# Patient Record
Sex: Female | Born: 1970
Health system: Southern US, Community
[De-identification: ages and names within clinical notes are randomized; demographics above are authoritative.]

## PROBLEM LIST (undated history)

## (undated) DIAGNOSIS — F419 Anxiety disorder, unspecified: Secondary | ICD-10-CM

## (undated) HISTORY — DX: Anxiety disorder, unspecified: F41.9

---

## 2000-10-25 ENCOUNTER — Inpatient Hospital Stay (HOSPITAL_COMMUNITY): Admission: AD | Admit: 2000-10-25 | Discharge: 2000-10-27 | Payer: Self-pay | Admitting: *Deleted

## 2001-12-06 ENCOUNTER — Other Ambulatory Visit: Admission: RE | Admit: 2001-12-06 | Discharge: 2001-12-06 | Payer: Self-pay | Admitting: Obstetrics and Gynecology

## 2002-12-26 ENCOUNTER — Other Ambulatory Visit: Admission: RE | Admit: 2002-12-26 | Discharge: 2002-12-26 | Payer: Self-pay | Admitting: Obstetrics and Gynecology

## 2003-07-06 ENCOUNTER — Inpatient Hospital Stay (HOSPITAL_COMMUNITY): Admission: AD | Admit: 2003-07-06 | Discharge: 2003-07-08 | Payer: Self-pay | Admitting: Obstetrics and Gynecology

## 2003-07-12 ENCOUNTER — Encounter: Admission: RE | Admit: 2003-07-12 | Discharge: 2003-07-12 | Payer: Self-pay | Admitting: Obstetrics and Gynecology

## 2003-07-12 ENCOUNTER — Encounter: Payer: Self-pay | Admitting: Obstetrics and Gynecology

## 2003-08-08 ENCOUNTER — Other Ambulatory Visit: Admission: RE | Admit: 2003-08-08 | Discharge: 2003-08-08 | Payer: Self-pay | Admitting: Obstetrics and Gynecology

## 2003-08-12 ENCOUNTER — Encounter: Payer: Self-pay | Admitting: Obstetrics and Gynecology

## 2003-08-12 ENCOUNTER — Encounter: Admission: RE | Admit: 2003-08-12 | Discharge: 2003-08-12 | Payer: Self-pay | Admitting: Obstetrics and Gynecology

## 2003-10-15 ENCOUNTER — Encounter: Admission: RE | Admit: 2003-10-15 | Discharge: 2003-10-15 | Payer: Self-pay | Admitting: Obstetrics and Gynecology

## 2004-04-01 ENCOUNTER — Encounter: Admission: RE | Admit: 2004-04-01 | Discharge: 2004-04-01 | Payer: Self-pay | Admitting: Obstetrics and Gynecology

## 2004-10-28 ENCOUNTER — Other Ambulatory Visit: Admission: RE | Admit: 2004-10-28 | Discharge: 2004-10-28 | Payer: Self-pay | Admitting: Obstetrics and Gynecology

## 2004-11-19 ENCOUNTER — Encounter: Admission: RE | Admit: 2004-11-19 | Discharge: 2004-11-19 | Payer: Self-pay | Admitting: Internal Medicine

## 2005-11-04 ENCOUNTER — Other Ambulatory Visit: Admission: RE | Admit: 2005-11-04 | Discharge: 2005-11-04 | Payer: Self-pay | Admitting: Obstetrics and Gynecology

## 2007-07-11 ENCOUNTER — Encounter: Admission: RE | Admit: 2007-07-11 | Discharge: 2007-07-11 | Payer: Self-pay | Admitting: Obstetrics and Gynecology

## 2008-01-30 ENCOUNTER — Encounter: Admission: RE | Admit: 2008-01-30 | Discharge: 2008-01-30 | Payer: Self-pay | Admitting: Internal Medicine

## 2010-12-20 ENCOUNTER — Encounter: Payer: Self-pay | Admitting: Obstetrics and Gynecology

## 2013-11-29 HISTORY — PX: BREAST ENHANCEMENT SURGERY: SHX7

## 2015-12-16 MED FILL — SERTRALINE HCL 100 MG TAB: 100 | 90 days supply | Qty: 90 | Fill #3

## 2015-12-16 MED FILL — PANTOPRAZOLE SOD DR 40 MG T: 40 | 90 days supply | Qty: 90 | Fill #3

## 2016-03-02 DIAGNOSIS — Z0000 Encounter for general adult medical examination without abnormal findings: Secondary | ICD-10-CM | POA: Diagnosis not present

## 2016-03-02 DIAGNOSIS — Z23 Encounter for immunization: Secondary | ICD-10-CM | POA: Diagnosis not present

## 2016-03-02 DIAGNOSIS — J3089 Other allergic rhinitis: Secondary | ICD-10-CM | POA: Diagnosis not present

## 2016-03-02 DIAGNOSIS — R5383 Other fatigue: Secondary | ICD-10-CM | POA: Diagnosis not present

## 2016-03-02 DIAGNOSIS — Z6820 Body mass index (BMI) 20.0-20.9, adult: Secondary | ICD-10-CM | POA: Diagnosis not present

## 2016-03-02 DIAGNOSIS — J3489 Other specified disorders of nose and nasal sinuses: Secondary | ICD-10-CM | POA: Diagnosis not present

## 2016-03-02 DIAGNOSIS — Z1389 Encounter for screening for other disorder: Secondary | ICD-10-CM | POA: Diagnosis not present

## 2016-03-02 DIAGNOSIS — K649 Unspecified hemorrhoids: Secondary | ICD-10-CM | POA: Diagnosis not present

## 2016-03-02 MED FILL — SERTRALINE HCL 100 MG TAB: 100 | 90 days supply | Qty: 90 | Fill #0

## 2016-03-02 MED FILL — PANTOPRAZOLE SOD DR 40 MG T: 40 | 90 days supply | Qty: 90 | Fill #0

## 2016-03-26 MED FILL — DOXYCYCLINE HYCLATE 100 MG: 100 | 14 days supply | Qty: 14 | Fill #0

## 2016-05-07 DIAGNOSIS — Z23 Encounter for immunization: Secondary | ICD-10-CM | POA: Diagnosis not present

## 2016-05-12 DIAGNOSIS — S86112A Strain of other muscle(s) and tendon(s) of posterior muscle group at lower leg level, left leg, initial encounter: Secondary | ICD-10-CM | POA: Diagnosis not present

## 2016-05-17 DIAGNOSIS — S86112D Strain of other muscle(s) and tendon(s) of posterior muscle group at lower leg level, left leg, subsequent encounter: Secondary | ICD-10-CM | POA: Diagnosis not present

## 2016-05-31 DIAGNOSIS — S86112D Strain of other muscle(s) and tendon(s) of posterior muscle group at lower leg level, left leg, subsequent encounter: Secondary | ICD-10-CM | POA: Diagnosis not present

## 2016-06-02 DIAGNOSIS — S86112D Strain of other muscle(s) and tendon(s) of posterior muscle group at lower leg level, left leg, subsequent encounter: Secondary | ICD-10-CM | POA: Diagnosis not present

## 2016-06-07 DIAGNOSIS — Z23 Encounter for immunization: Secondary | ICD-10-CM | POA: Diagnosis not present

## 2016-06-08 DIAGNOSIS — S86112D Strain of other muscle(s) and tendon(s) of posterior muscle group at lower leg level, left leg, subsequent encounter: Secondary | ICD-10-CM | POA: Diagnosis not present

## 2016-06-21 DIAGNOSIS — S86112D Strain of other muscle(s) and tendon(s) of posterior muscle group at lower leg level, left leg, subsequent encounter: Secondary | ICD-10-CM | POA: Diagnosis not present

## 2016-06-23 DIAGNOSIS — S86112D Strain of other muscle(s) and tendon(s) of posterior muscle group at lower leg level, left leg, subsequent encounter: Secondary | ICD-10-CM | POA: Diagnosis not present

## 2016-06-29 DIAGNOSIS — Z1389 Encounter for screening for other disorder: Secondary | ICD-10-CM | POA: Diagnosis not present

## 2016-06-29 DIAGNOSIS — Z130 Encounter for screening for diseases of the blood and blood-forming organs and certain disorders involving the immune mechanism: Secondary | ICD-10-CM | POA: Diagnosis not present

## 2016-06-29 DIAGNOSIS — Z124 Encounter for screening for malignant neoplasm of cervix: Secondary | ICD-10-CM | POA: Diagnosis not present

## 2016-06-29 DIAGNOSIS — Z01419 Encounter for gynecological examination (general) (routine) without abnormal findings: Secondary | ICD-10-CM | POA: Diagnosis not present

## 2016-06-29 DIAGNOSIS — Z6820 Body mass index (BMI) 20.0-20.9, adult: Secondary | ICD-10-CM | POA: Diagnosis not present

## 2016-06-29 DIAGNOSIS — Z1231 Encounter for screening mammogram for malignant neoplasm of breast: Secondary | ICD-10-CM | POA: Diagnosis not present

## 2016-07-13 MED FILL — FLUCONAZOLE 150 MG TABLET: 150 | 2 days supply | Qty: 2 | Fill #0

## 2016-07-19 DIAGNOSIS — R3915 Urgency of urination: Secondary | ICD-10-CM | POA: Diagnosis not present

## 2016-07-19 DIAGNOSIS — N898 Other specified noninflammatory disorders of vagina: Secondary | ICD-10-CM | POA: Diagnosis not present

## 2016-09-20 MED FILL — PANTOPRAZOLE SOD DR 40 MG T: 40 | 90 days supply | Qty: 90 | Fill #1

## 2016-09-20 MED FILL — SERTRALINE HCL 100 MG TAB: 100 | 90 days supply | Qty: 90 | Fill #1

## 2016-10-05 DIAGNOSIS — H524 Presbyopia: Secondary | ICD-10-CM | POA: Diagnosis not present

## 2016-10-05 DIAGNOSIS — H02825 Cysts of left lower eyelid: Secondary | ICD-10-CM | POA: Diagnosis not present

## 2016-10-05 DIAGNOSIS — H5213 Myopia, bilateral: Secondary | ICD-10-CM | POA: Diagnosis not present

## 2017-01-04 MED FILL — PANTOPRAZOLE SOD DR 40 MG T: 40 | 90 days supply | Qty: 90 | Fill #2

## 2017-01-04 MED FILL — SERTRALINE HCL 100 MG TAB: 100 | 90 days supply | Qty: 90 | Fill #2

## 2017-03-08 DIAGNOSIS — Z23 Encounter for immunization: Secondary | ICD-10-CM | POA: Diagnosis not present

## 2017-03-10 DIAGNOSIS — H0014 Chalazion left upper eyelid: Secondary | ICD-10-CM | POA: Diagnosis not present

## 2017-03-11 DIAGNOSIS — H00014 Hordeolum externum left upper eyelid: Secondary | ICD-10-CM | POA: Diagnosis not present

## 2017-03-14 DIAGNOSIS — H00014 Hordeolum externum left upper eyelid: Secondary | ICD-10-CM | POA: Diagnosis not present

## 2017-03-21 DIAGNOSIS — H00014 Hordeolum externum left upper eyelid: Secondary | ICD-10-CM | POA: Diagnosis not present

## 2017-03-21 DIAGNOSIS — S0501XD Injury of conjunctiva and corneal abrasion without foreign body, right eye, subsequent encounter: Secondary | ICD-10-CM | POA: Diagnosis not present

## 2017-03-29 DIAGNOSIS — Z0000 Encounter for general adult medical examination without abnormal findings: Secondary | ICD-10-CM | POA: Diagnosis not present

## 2017-04-04 DIAGNOSIS — J3089 Other allergic rhinitis: Secondary | ICD-10-CM | POA: Diagnosis not present

## 2017-04-04 DIAGNOSIS — F418 Other specified anxiety disorders: Secondary | ICD-10-CM | POA: Diagnosis not present

## 2017-04-04 DIAGNOSIS — K649 Unspecified hemorrhoids: Secondary | ICD-10-CM | POA: Diagnosis not present

## 2017-04-04 DIAGNOSIS — J3489 Other specified disorders of nose and nasal sinuses: Secondary | ICD-10-CM | POA: Diagnosis not present

## 2017-04-04 DIAGNOSIS — Z1389 Encounter for screening for other disorder: Secondary | ICD-10-CM | POA: Diagnosis not present

## 2017-04-04 DIAGNOSIS — R5383 Other fatigue: Secondary | ICD-10-CM | POA: Diagnosis not present

## 2017-04-04 DIAGNOSIS — R32 Unspecified urinary incontinence: Secondary | ICD-10-CM | POA: Diagnosis not present

## 2017-04-04 DIAGNOSIS — Z6821 Body mass index (BMI) 21.0-21.9, adult: Secondary | ICD-10-CM | POA: Diagnosis not present

## 2017-04-04 DIAGNOSIS — Z0000 Encounter for general adult medical examination without abnormal findings: Secondary | ICD-10-CM | POA: Diagnosis not present

## 2017-04-04 DIAGNOSIS — E875 Hyperkalemia: Secondary | ICD-10-CM | POA: Diagnosis not present

## 2017-05-06 DIAGNOSIS — H0014 Chalazion left upper eyelid: Secondary | ICD-10-CM | POA: Diagnosis not present

## 2017-05-31 MED FILL — SERTRALINE HCL 100 MG TAB: 100 | 90 days supply | Qty: 90 | Fill #0

## 2017-08-09 DIAGNOSIS — Z6821 Body mass index (BMI) 21.0-21.9, adult: Secondary | ICD-10-CM | POA: Diagnosis not present

## 2017-08-09 DIAGNOSIS — Z130 Encounter for screening for diseases of the blood and blood-forming organs and certain disorders involving the immune mechanism: Secondary | ICD-10-CM | POA: Diagnosis not present

## 2017-08-09 DIAGNOSIS — Z01419 Encounter for gynecological examination (general) (routine) without abnormal findings: Secondary | ICD-10-CM | POA: Diagnosis not present

## 2017-08-09 DIAGNOSIS — Z1231 Encounter for screening mammogram for malignant neoplasm of breast: Secondary | ICD-10-CM | POA: Diagnosis not present

## 2017-08-09 DIAGNOSIS — Z1389 Encounter for screening for other disorder: Secondary | ICD-10-CM | POA: Diagnosis not present

## 2017-09-19 DIAGNOSIS — M5412 Radiculopathy, cervical region: Secondary | ICD-10-CM | POA: Diagnosis not present

## 2017-09-19 DIAGNOSIS — M9907 Segmental and somatic dysfunction of upper extremity: Secondary | ICD-10-CM | POA: Diagnosis not present

## 2017-09-19 DIAGNOSIS — M9901 Segmental and somatic dysfunction of cervical region: Secondary | ICD-10-CM | POA: Diagnosis not present

## 2017-09-19 DIAGNOSIS — G5601 Carpal tunnel syndrome, right upper limb: Secondary | ICD-10-CM | POA: Diagnosis not present

## 2017-09-20 DIAGNOSIS — G5601 Carpal tunnel syndrome, right upper limb: Secondary | ICD-10-CM | POA: Diagnosis not present

## 2017-09-20 DIAGNOSIS — M9907 Segmental and somatic dysfunction of upper extremity: Secondary | ICD-10-CM | POA: Diagnosis not present

## 2017-09-20 DIAGNOSIS — M9901 Segmental and somatic dysfunction of cervical region: Secondary | ICD-10-CM | POA: Diagnosis not present

## 2017-09-20 DIAGNOSIS — M5412 Radiculopathy, cervical region: Secondary | ICD-10-CM | POA: Diagnosis not present

## 2017-09-21 DIAGNOSIS — M9901 Segmental and somatic dysfunction of cervical region: Secondary | ICD-10-CM | POA: Diagnosis not present

## 2017-09-21 DIAGNOSIS — M9907 Segmental and somatic dysfunction of upper extremity: Secondary | ICD-10-CM | POA: Diagnosis not present

## 2017-09-21 DIAGNOSIS — M5412 Radiculopathy, cervical region: Secondary | ICD-10-CM | POA: Diagnosis not present

## 2017-09-21 DIAGNOSIS — G5601 Carpal tunnel syndrome, right upper limb: Secondary | ICD-10-CM | POA: Diagnosis not present

## 2017-09-26 DIAGNOSIS — G5601 Carpal tunnel syndrome, right upper limb: Secondary | ICD-10-CM | POA: Diagnosis not present

## 2017-09-26 DIAGNOSIS — M9907 Segmental and somatic dysfunction of upper extremity: Secondary | ICD-10-CM | POA: Diagnosis not present

## 2017-09-26 DIAGNOSIS — M9901 Segmental and somatic dysfunction of cervical region: Secondary | ICD-10-CM | POA: Diagnosis not present

## 2017-09-26 DIAGNOSIS — M5412 Radiculopathy, cervical region: Secondary | ICD-10-CM | POA: Diagnosis not present

## 2017-09-27 DIAGNOSIS — M9901 Segmental and somatic dysfunction of cervical region: Secondary | ICD-10-CM | POA: Diagnosis not present

## 2017-09-27 DIAGNOSIS — M9907 Segmental and somatic dysfunction of upper extremity: Secondary | ICD-10-CM | POA: Diagnosis not present

## 2017-09-27 DIAGNOSIS — M5412 Radiculopathy, cervical region: Secondary | ICD-10-CM | POA: Diagnosis not present

## 2017-09-27 DIAGNOSIS — G5601 Carpal tunnel syndrome, right upper limb: Secondary | ICD-10-CM | POA: Diagnosis not present

## 2017-09-28 DIAGNOSIS — M9901 Segmental and somatic dysfunction of cervical region: Secondary | ICD-10-CM | POA: Diagnosis not present

## 2017-09-28 DIAGNOSIS — M5412 Radiculopathy, cervical region: Secondary | ICD-10-CM | POA: Diagnosis not present

## 2017-09-28 DIAGNOSIS — G5601 Carpal tunnel syndrome, right upper limb: Secondary | ICD-10-CM | POA: Diagnosis not present

## 2017-09-28 DIAGNOSIS — M9907 Segmental and somatic dysfunction of upper extremity: Secondary | ICD-10-CM | POA: Diagnosis not present

## 2017-09-30 MED FILL — SERTRALINE HCL 100 MG TAB: 100 | 90 days supply | Qty: 90 | Fill #1

## 2017-10-03 DIAGNOSIS — M9901 Segmental and somatic dysfunction of cervical region: Secondary | ICD-10-CM | POA: Diagnosis not present

## 2017-10-03 DIAGNOSIS — M9907 Segmental and somatic dysfunction of upper extremity: Secondary | ICD-10-CM | POA: Diagnosis not present

## 2017-10-03 DIAGNOSIS — M5412 Radiculopathy, cervical region: Secondary | ICD-10-CM | POA: Diagnosis not present

## 2017-10-03 DIAGNOSIS — G5601 Carpal tunnel syndrome, right upper limb: Secondary | ICD-10-CM | POA: Diagnosis not present

## 2017-10-04 DIAGNOSIS — M5412 Radiculopathy, cervical region: Secondary | ICD-10-CM | POA: Diagnosis not present

## 2017-10-04 DIAGNOSIS — M9907 Segmental and somatic dysfunction of upper extremity: Secondary | ICD-10-CM | POA: Diagnosis not present

## 2017-10-04 DIAGNOSIS — G5601 Carpal tunnel syndrome, right upper limb: Secondary | ICD-10-CM | POA: Diagnosis not present

## 2017-10-04 DIAGNOSIS — M9901 Segmental and somatic dysfunction of cervical region: Secondary | ICD-10-CM | POA: Diagnosis not present

## 2017-10-05 DIAGNOSIS — M9901 Segmental and somatic dysfunction of cervical region: Secondary | ICD-10-CM | POA: Diagnosis not present

## 2017-10-05 DIAGNOSIS — M9907 Segmental and somatic dysfunction of upper extremity: Secondary | ICD-10-CM | POA: Diagnosis not present

## 2017-10-05 DIAGNOSIS — M5412 Radiculopathy, cervical region: Secondary | ICD-10-CM | POA: Diagnosis not present

## 2017-10-05 DIAGNOSIS — G5601 Carpal tunnel syndrome, right upper limb: Secondary | ICD-10-CM | POA: Diagnosis not present

## 2017-10-10 DIAGNOSIS — M9901 Segmental and somatic dysfunction of cervical region: Secondary | ICD-10-CM | POA: Diagnosis not present

## 2017-10-10 DIAGNOSIS — M9907 Segmental and somatic dysfunction of upper extremity: Secondary | ICD-10-CM | POA: Diagnosis not present

## 2017-10-10 DIAGNOSIS — G5601 Carpal tunnel syndrome, right upper limb: Secondary | ICD-10-CM | POA: Diagnosis not present

## 2017-10-10 DIAGNOSIS — M5412 Radiculopathy, cervical region: Secondary | ICD-10-CM | POA: Diagnosis not present

## 2017-10-11 DIAGNOSIS — M9907 Segmental and somatic dysfunction of upper extremity: Secondary | ICD-10-CM | POA: Diagnosis not present

## 2017-10-11 DIAGNOSIS — M9901 Segmental and somatic dysfunction of cervical region: Secondary | ICD-10-CM | POA: Diagnosis not present

## 2017-10-11 DIAGNOSIS — M5412 Radiculopathy, cervical region: Secondary | ICD-10-CM | POA: Diagnosis not present

## 2017-10-11 DIAGNOSIS — G5601 Carpal tunnel syndrome, right upper limb: Secondary | ICD-10-CM | POA: Diagnosis not present

## 2017-10-12 DIAGNOSIS — G5601 Carpal tunnel syndrome, right upper limb: Secondary | ICD-10-CM | POA: Diagnosis not present

## 2017-10-12 DIAGNOSIS — M9901 Segmental and somatic dysfunction of cervical region: Secondary | ICD-10-CM | POA: Diagnosis not present

## 2017-10-12 DIAGNOSIS — M5412 Radiculopathy, cervical region: Secondary | ICD-10-CM | POA: Diagnosis not present

## 2017-10-12 DIAGNOSIS — M9907 Segmental and somatic dysfunction of upper extremity: Secondary | ICD-10-CM | POA: Diagnosis not present

## 2017-10-17 DIAGNOSIS — G5601 Carpal tunnel syndrome, right upper limb: Secondary | ICD-10-CM | POA: Diagnosis not present

## 2017-10-17 DIAGNOSIS — M5412 Radiculopathy, cervical region: Secondary | ICD-10-CM | POA: Diagnosis not present

## 2017-10-17 DIAGNOSIS — M9907 Segmental and somatic dysfunction of upper extremity: Secondary | ICD-10-CM | POA: Diagnosis not present

## 2017-10-17 DIAGNOSIS — M9901 Segmental and somatic dysfunction of cervical region: Secondary | ICD-10-CM | POA: Diagnosis not present

## 2017-11-08 MED FILL — ALPRAZolam 0.5 MG TABS: 0.5 | 5 days supply | Qty: 10 | Fill #0

## 2017-12-02 DIAGNOSIS — H5213 Myopia, bilateral: Secondary | ICD-10-CM | POA: Diagnosis not present

## 2017-12-02 DIAGNOSIS — H35362 Drusen (degenerative) of macula, left eye: Secondary | ICD-10-CM | POA: Diagnosis not present

## 2017-12-02 DIAGNOSIS — H04123 Dry eye syndrome of bilateral lacrimal glands: Secondary | ICD-10-CM | POA: Diagnosis not present

## 2017-12-27 MED FILL — FLUCONAZOLE 150 MG TABLET: 150 | 2 days supply | Qty: 2 | Fill #0

## 2018-01-06 MED FILL — SERTRALINE HCL 100 MG TAB: 100 | 90 days supply | Qty: 90 | Fill #2

## 2018-04-07 MED FILL — SERTRALINE HCL 100 MG TAB: 100 | 90 days supply | Qty: 90 | Fill #3

## 2018-05-09 DIAGNOSIS — E875 Hyperkalemia: Secondary | ICD-10-CM | POA: Diagnosis not present

## 2018-05-09 DIAGNOSIS — Z1331 Encounter for screening for depression: Secondary | ICD-10-CM | POA: Diagnosis not present

## 2018-05-09 DIAGNOSIS — Z23 Encounter for immunization: Secondary | ICD-10-CM | POA: Diagnosis not present

## 2018-05-09 DIAGNOSIS — R32 Unspecified urinary incontinence: Secondary | ICD-10-CM | POA: Diagnosis not present

## 2018-05-09 DIAGNOSIS — R5383 Other fatigue: Secondary | ICD-10-CM | POA: Diagnosis not present

## 2018-05-09 DIAGNOSIS — J3489 Other specified disorders of nose and nasal sinuses: Secondary | ICD-10-CM | POA: Diagnosis not present

## 2018-05-09 DIAGNOSIS — Z0000 Encounter for general adult medical examination without abnormal findings: Secondary | ICD-10-CM | POA: Diagnosis not present

## 2018-07-19 MED FILL — SERTRALINE HCL 100 MG TAB: 100 | 90 days supply | Qty: 90 | Fill #0

## 2018-08-22 DIAGNOSIS — Z1389 Encounter for screening for other disorder: Secondary | ICD-10-CM | POA: Diagnosis not present

## 2018-08-22 DIAGNOSIS — Z01419 Encounter for gynecological examination (general) (routine) without abnormal findings: Secondary | ICD-10-CM | POA: Diagnosis not present

## 2018-08-22 DIAGNOSIS — Z1231 Encounter for screening mammogram for malignant neoplasm of breast: Secondary | ICD-10-CM | POA: Diagnosis not present

## 2018-08-22 DIAGNOSIS — Z130 Encounter for screening for diseases of the blood and blood-forming organs and certain disorders involving the immune mechanism: Secondary | ICD-10-CM | POA: Diagnosis not present

## 2018-08-22 DIAGNOSIS — Z6821 Body mass index (BMI) 21.0-21.9, adult: Secondary | ICD-10-CM | POA: Diagnosis not present

## 2018-08-28 DIAGNOSIS — S43421A Sprain of right rotator cuff capsule, initial encounter: Secondary | ICD-10-CM | POA: Diagnosis not present

## 2018-08-28 DIAGNOSIS — M9901 Segmental and somatic dysfunction of cervical region: Secondary | ICD-10-CM | POA: Diagnosis not present

## 2018-08-28 DIAGNOSIS — M9907 Segmental and somatic dysfunction of upper extremity: Secondary | ICD-10-CM | POA: Diagnosis not present

## 2018-08-28 DIAGNOSIS — M5412 Radiculopathy, cervical region: Secondary | ICD-10-CM | POA: Diagnosis not present

## 2018-08-29 DIAGNOSIS — M5412 Radiculopathy, cervical region: Secondary | ICD-10-CM | POA: Diagnosis not present

## 2018-08-29 DIAGNOSIS — S43421A Sprain of right rotator cuff capsule, initial encounter: Secondary | ICD-10-CM | POA: Diagnosis not present

## 2018-08-29 DIAGNOSIS — M9907 Segmental and somatic dysfunction of upper extremity: Secondary | ICD-10-CM | POA: Diagnosis not present

## 2018-08-29 DIAGNOSIS — M9901 Segmental and somatic dysfunction of cervical region: Secondary | ICD-10-CM | POA: Diagnosis not present

## 2018-09-04 DIAGNOSIS — M9901 Segmental and somatic dysfunction of cervical region: Secondary | ICD-10-CM | POA: Diagnosis not present

## 2018-09-04 DIAGNOSIS — S43421A Sprain of right rotator cuff capsule, initial encounter: Secondary | ICD-10-CM | POA: Diagnosis not present

## 2018-09-04 DIAGNOSIS — M9907 Segmental and somatic dysfunction of upper extremity: Secondary | ICD-10-CM | POA: Diagnosis not present

## 2018-09-04 DIAGNOSIS — M5412 Radiculopathy, cervical region: Secondary | ICD-10-CM | POA: Diagnosis not present

## 2018-09-06 DIAGNOSIS — M9907 Segmental and somatic dysfunction of upper extremity: Secondary | ICD-10-CM | POA: Diagnosis not present

## 2018-09-06 DIAGNOSIS — S43421A Sprain of right rotator cuff capsule, initial encounter: Secondary | ICD-10-CM | POA: Diagnosis not present

## 2018-09-06 DIAGNOSIS — M5412 Radiculopathy, cervical region: Secondary | ICD-10-CM | POA: Diagnosis not present

## 2018-09-06 DIAGNOSIS — M9901 Segmental and somatic dysfunction of cervical region: Secondary | ICD-10-CM | POA: Diagnosis not present

## 2018-09-11 DIAGNOSIS — S43421A Sprain of right rotator cuff capsule, initial encounter: Secondary | ICD-10-CM | POA: Diagnosis not present

## 2018-09-11 DIAGNOSIS — M9907 Segmental and somatic dysfunction of upper extremity: Secondary | ICD-10-CM | POA: Diagnosis not present

## 2018-09-11 DIAGNOSIS — M9901 Segmental and somatic dysfunction of cervical region: Secondary | ICD-10-CM | POA: Diagnosis not present

## 2018-09-11 DIAGNOSIS — M5412 Radiculopathy, cervical region: Secondary | ICD-10-CM | POA: Diagnosis not present

## 2018-09-13 DIAGNOSIS — M5412 Radiculopathy, cervical region: Secondary | ICD-10-CM | POA: Diagnosis not present

## 2018-09-13 DIAGNOSIS — S43421A Sprain of right rotator cuff capsule, initial encounter: Secondary | ICD-10-CM | POA: Diagnosis not present

## 2018-09-13 DIAGNOSIS — M9901 Segmental and somatic dysfunction of cervical region: Secondary | ICD-10-CM | POA: Diagnosis not present

## 2018-09-13 DIAGNOSIS — M9907 Segmental and somatic dysfunction of upper extremity: Secondary | ICD-10-CM | POA: Diagnosis not present

## 2018-09-18 DIAGNOSIS — M5412 Radiculopathy, cervical region: Secondary | ICD-10-CM | POA: Diagnosis not present

## 2018-09-18 DIAGNOSIS — S43421A Sprain of right rotator cuff capsule, initial encounter: Secondary | ICD-10-CM | POA: Diagnosis not present

## 2018-09-18 DIAGNOSIS — M9901 Segmental and somatic dysfunction of cervical region: Secondary | ICD-10-CM | POA: Diagnosis not present

## 2018-09-18 DIAGNOSIS — M9907 Segmental and somatic dysfunction of upper extremity: Secondary | ICD-10-CM | POA: Diagnosis not present

## 2018-09-21 DIAGNOSIS — S43421A Sprain of right rotator cuff capsule, initial encounter: Secondary | ICD-10-CM | POA: Diagnosis not present

## 2018-09-21 DIAGNOSIS — M5412 Radiculopathy, cervical region: Secondary | ICD-10-CM | POA: Diagnosis not present

## 2018-09-21 DIAGNOSIS — M9901 Segmental and somatic dysfunction of cervical region: Secondary | ICD-10-CM | POA: Diagnosis not present

## 2018-09-21 DIAGNOSIS — M9907 Segmental and somatic dysfunction of upper extremity: Secondary | ICD-10-CM | POA: Diagnosis not present

## 2018-09-25 DIAGNOSIS — M9901 Segmental and somatic dysfunction of cervical region: Secondary | ICD-10-CM | POA: Diagnosis not present

## 2018-09-25 DIAGNOSIS — M5412 Radiculopathy, cervical region: Secondary | ICD-10-CM | POA: Diagnosis not present

## 2018-09-25 DIAGNOSIS — S43421A Sprain of right rotator cuff capsule, initial encounter: Secondary | ICD-10-CM | POA: Diagnosis not present

## 2018-09-25 DIAGNOSIS — M9907 Segmental and somatic dysfunction of upper extremity: Secondary | ICD-10-CM | POA: Diagnosis not present

## 2018-09-27 DIAGNOSIS — M5412 Radiculopathy, cervical region: Secondary | ICD-10-CM | POA: Diagnosis not present

## 2018-09-27 DIAGNOSIS — M9907 Segmental and somatic dysfunction of upper extremity: Secondary | ICD-10-CM | POA: Diagnosis not present

## 2018-09-27 DIAGNOSIS — M9901 Segmental and somatic dysfunction of cervical region: Secondary | ICD-10-CM | POA: Diagnosis not present

## 2018-09-27 DIAGNOSIS — S43421A Sprain of right rotator cuff capsule, initial encounter: Secondary | ICD-10-CM | POA: Diagnosis not present

## 2018-09-29 DIAGNOSIS — M542 Cervicalgia: Secondary | ICD-10-CM | POA: Diagnosis not present

## 2018-09-29 DIAGNOSIS — M519 Unspecified thoracic, thoracolumbar and lumbosacral intervertebral disc disorder: Secondary | ICD-10-CM | POA: Diagnosis not present

## 2018-09-29 MED FILL — predniSONE 5 MG TABS: 5 | 6 days supply | Qty: 21 | Fill #0

## 2018-09-29 MED FILL — METHOCARBAMOL 500 MG TABS: 500 | 10 days supply | Qty: 30 | Fill #0

## 2018-09-29 MED FILL — GABAPENTIN 300 MG CAPSULE: 300 | 30 days supply | Qty: 90 | Fill #0

## 2018-10-03 DIAGNOSIS — M9907 Segmental and somatic dysfunction of upper extremity: Secondary | ICD-10-CM | POA: Diagnosis not present

## 2018-10-03 DIAGNOSIS — M5412 Radiculopathy, cervical region: Secondary | ICD-10-CM | POA: Diagnosis not present

## 2018-10-03 DIAGNOSIS — M9901 Segmental and somatic dysfunction of cervical region: Secondary | ICD-10-CM | POA: Diagnosis not present

## 2018-10-03 DIAGNOSIS — S43421A Sprain of right rotator cuff capsule, initial encounter: Secondary | ICD-10-CM | POA: Diagnosis not present

## 2018-10-04 DIAGNOSIS — M9901 Segmental and somatic dysfunction of cervical region: Secondary | ICD-10-CM | POA: Diagnosis not present

## 2018-10-04 DIAGNOSIS — M5412 Radiculopathy, cervical region: Secondary | ICD-10-CM | POA: Diagnosis not present

## 2018-10-04 DIAGNOSIS — S43421A Sprain of right rotator cuff capsule, initial encounter: Secondary | ICD-10-CM | POA: Diagnosis not present

## 2018-10-04 DIAGNOSIS — M9907 Segmental and somatic dysfunction of upper extremity: Secondary | ICD-10-CM | POA: Diagnosis not present

## 2018-10-09 DIAGNOSIS — M5412 Radiculopathy, cervical region: Secondary | ICD-10-CM | POA: Diagnosis not present

## 2018-10-09 DIAGNOSIS — M9907 Segmental and somatic dysfunction of upper extremity: Secondary | ICD-10-CM | POA: Diagnosis not present

## 2018-10-09 DIAGNOSIS — M9901 Segmental and somatic dysfunction of cervical region: Secondary | ICD-10-CM | POA: Diagnosis not present

## 2018-10-09 DIAGNOSIS — S43421A Sprain of right rotator cuff capsule, initial encounter: Secondary | ICD-10-CM | POA: Diagnosis not present

## 2018-10-12 DIAGNOSIS — M79641 Pain in right hand: Secondary | ICD-10-CM | POA: Diagnosis not present

## 2018-10-12 DIAGNOSIS — M67441 Ganglion, right hand: Secondary | ICD-10-CM | POA: Diagnosis not present

## 2018-10-30 DIAGNOSIS — M5412 Radiculopathy, cervical region: Secondary | ICD-10-CM | POA: Diagnosis not present

## 2018-10-30 MED FILL — SERTRALINE HCL 100 MG TAB: 100 | 90 days supply | Qty: 90 | Fill #1

## 2018-11-06 DIAGNOSIS — M519 Unspecified thoracic, thoracolumbar and lumbosacral intervertebral disc disorder: Secondary | ICD-10-CM | POA: Diagnosis not present

## 2018-11-06 DIAGNOSIS — M5412 Radiculopathy, cervical region: Secondary | ICD-10-CM | POA: Diagnosis not present

## 2018-11-10 DIAGNOSIS — M542 Cervicalgia: Secondary | ICD-10-CM | POA: Diagnosis not present

## 2018-12-08 MED FILL — SERTRALINE HCL 100 MG TAB: 100 | 30 days supply | Qty: 30 | Fill #2

## 2018-12-12 DIAGNOSIS — H35362 Drusen (degenerative) of macula, left eye: Secondary | ICD-10-CM | POA: Diagnosis not present

## 2018-12-12 DIAGNOSIS — H04123 Dry eye syndrome of bilateral lacrimal glands: Secondary | ICD-10-CM | POA: Diagnosis not present

## 2019-01-09 MED FILL — SERTRALINE HCL 100 MG TAB: 100 | 30 days supply | Qty: 30 | Fill #3

## 2019-02-02 MED FILL — ALPRAZolam 0.5 MG TABS: 0.5 | 5 days supply | Qty: 10 | Fill #0

## 2019-02-07 MED FILL — SERTRALINE HCL 100 MG TAB: 100 | 30 days supply | Qty: 30 | Fill #4 | Status: TO

## 2019-03-12 MED FILL — SERTRALINE HCL 100 MG TAB: 100 | 30 days supply | Qty: 30 | Fill #0

## 2019-04-05 DIAGNOSIS — J309 Allergic rhinitis, unspecified: Secondary | ICD-10-CM | POA: Diagnosis not present

## 2019-04-05 DIAGNOSIS — J45998 Other asthma: Secondary | ICD-10-CM | POA: Diagnosis not present

## 2019-04-16 MED FILL — SERTRALINE HCL 100 MG TAB: 100 | 30 days supply | Qty: 30 | Fill #1

## 2019-05-18 MED FILL — SERTRALINE HCL 100 MG TAB: 100 | 30 days supply | Qty: 30 | Fill #0

## 2019-06-18 MED FILL — SERTRALINE HCL 100 MG TAB: 100 | 30 days supply | Qty: 30 | Fill #1

## 2019-07-26 MED FILL — SERTRALINE HCL 100 MG TAB: 100 | 30 days supply | Qty: 30 | Fill #0

## 2019-08-29 MED FILL — SERTRALINE HCL 100 MG TAB: 100 | 30 days supply | Qty: 30 | Fill #1

## 2019-09-24 MED FILL — SERTRALINE HCL 100 MG TAB: 100 | 30 days supply | Qty: 30 | Fill #2

## 2019-10-24 MED FILL — SERTRALINE HCL 100 MG TAB: 100 | 30 days supply | Qty: 30 | Fill #3

## 2019-11-26 MED FILL — SERTRALINE HCL 100 MG TAB: 100 | 30 days supply | Qty: 30 | Fill #4

## 2019-12-14 DIAGNOSIS — H04123 Dry eye syndrome of bilateral lacrimal glands: Secondary | ICD-10-CM | POA: Diagnosis not present

## 2019-12-14 DIAGNOSIS — H35362 Drusen (degenerative) of macula, left eye: Secondary | ICD-10-CM | POA: Diagnosis not present

## 2019-12-14 DIAGNOSIS — R590 Localized enlarged lymph nodes: Secondary | ICD-10-CM | POA: Diagnosis not present

## 2019-12-14 DIAGNOSIS — H5213 Myopia, bilateral: Secondary | ICD-10-CM | POA: Diagnosis not present

## 2019-12-25 ENCOUNTER — Ambulatory Visit: Payer: BC Managed Care – PPO | Attending: Internal Medicine

## 2019-12-25 DIAGNOSIS — Z20828 Contact with and (suspected) exposure to other viral communicable diseases: Secondary | ICD-10-CM | POA: Diagnosis not present

## 2019-12-26 MED FILL — SERTRALINE HCL 100 MG TAB: 100 | 30 days supply | Qty: 30 | Fill #5

## 2020-01-25 MED FILL — SERTRALINE HCL 100 MG TAB: 100 | 30 days supply | Qty: 30 | Fill #6

## 2020-02-27 MED FILL — SERTRALINE HCL 100 MG TAB: 100 | 30 days supply | Qty: 30 | Fill #7

## 2020-03-28 MED FILL — SERTRALINE HCL 100 MG TAB: 100 | 30 days supply | Qty: 30 | Fill #8

## 2020-05-26 MED FILL — SERTRALINE HCL 100 MG TAB: 100 | 30 days supply | Qty: 30 | Fill #9

## 2020-06-26 MED FILL — SERTRALINE HCL 100 MG TAB: 100 | 30 days supply | Qty: 30 | Fill #10

## 2020-08-01 ENCOUNTER — Other Ambulatory Visit (HOSPITAL_COMMUNITY): Payer: Self-pay | Admitting: Internal Medicine

## 2020-08-01 MED FILL — SERTRALINE HCL 100 MG TAB: 100 | 90 days supply | Qty: 90 | Fill #0

## 2020-10-10 DIAGNOSIS — Z0000 Encounter for general adult medical examination without abnormal findings: Secondary | ICD-10-CM | POA: Diagnosis not present

## 2020-10-15 DIAGNOSIS — Z6821 Body mass index (BMI) 21.0-21.9, adult: Secondary | ICD-10-CM | POA: Diagnosis not present

## 2020-10-15 DIAGNOSIS — Z01419 Encounter for gynecological examination (general) (routine) without abnormal findings: Secondary | ICD-10-CM | POA: Diagnosis not present

## 2020-10-15 DIAGNOSIS — Z1231 Encounter for screening mammogram for malignant neoplasm of breast: Secondary | ICD-10-CM | POA: Diagnosis not present

## 2020-10-15 DIAGNOSIS — Z130 Encounter for screening for diseases of the blood and blood-forming organs and certain disorders involving the immune mechanism: Secondary | ICD-10-CM | POA: Diagnosis not present

## 2020-10-15 DIAGNOSIS — Z1389 Encounter for screening for other disorder: Secondary | ICD-10-CM | POA: Diagnosis not present

## 2020-10-31 DIAGNOSIS — J309 Allergic rhinitis, unspecified: Secondary | ICD-10-CM | POA: Diagnosis not present

## 2020-10-31 DIAGNOSIS — Z1331 Encounter for screening for depression: Secondary | ICD-10-CM | POA: Diagnosis not present

## 2020-10-31 DIAGNOSIS — Z0000 Encounter for general adult medical examination without abnormal findings: Secondary | ICD-10-CM | POA: Diagnosis not present

## 2020-10-31 DIAGNOSIS — R82998 Other abnormal findings in urine: Secondary | ICD-10-CM | POA: Diagnosis not present

## 2020-11-10 MED FILL — SERTRALINE HCL 100 MG TAB: 100 | 90 days supply | Qty: 90 | Fill #1

## 2020-12-19 DIAGNOSIS — H5213 Myopia, bilateral: Secondary | ICD-10-CM | POA: Diagnosis not present

## 2020-12-19 DIAGNOSIS — H524 Presbyopia: Secondary | ICD-10-CM | POA: Diagnosis not present

## 2020-12-19 DIAGNOSIS — H0100B Unspecified blepharitis left eye, upper and lower eyelids: Secondary | ICD-10-CM | POA: Diagnosis not present

## 2020-12-19 DIAGNOSIS — H0100A Unspecified blepharitis right eye, upper and lower eyelids: Secondary | ICD-10-CM | POA: Diagnosis not present

## 2021-01-09 ENCOUNTER — Other Ambulatory Visit (HOSPITAL_COMMUNITY): Payer: Self-pay | Admitting: Internal Medicine

## 2021-01-09 DIAGNOSIS — R059 Cough, unspecified: Secondary | ICD-10-CM | POA: Diagnosis not present

## 2021-01-09 MED FILL — AZITHROMYCIN 500 MG TABLET: 500 | 3 days supply | Qty: 3 | Fill #0

## 2021-02-05 ENCOUNTER — Encounter: Payer: Self-pay | Admitting: Internal Medicine

## 2021-02-05 NOTE — Telephone Encounter (Signed)
Please schedule patient for a colonoscopy direct on May 6.  2:30 PM is the appointment time.  She will need a previsit time as well.  If you will call her and contact her.  We have agreed on May 6 at 37 already but she needs other information.  Diagnosis is colon cancer screening.

## 2021-02-06 ENCOUNTER — Telehealth: Payer: Self-pay | Admitting: Internal Medicine

## 2021-02-06 ENCOUNTER — Encounter: Payer: Self-pay | Admitting: Internal Medicine

## 2021-02-06 NOTE — Telephone Encounter (Signed)
This encounter was created in error - please disregard.

## 2021-02-06 NOTE — Telephone Encounter (Signed)
Please schedule screening colonoscopy Fr May 6 - patient wants one ofg the open slots there

## 2021-02-06 NOTE — Telephone Encounter (Signed)
I spoke to Courtney Bentley and got her booked for a pre-visit and colonoscopy appointment. Paperwork mailed to her. Will check on getting the charts merged.

## 2021-03-07 ENCOUNTER — Other Ambulatory Visit (HOSPITAL_COMMUNITY): Payer: Self-pay | Admitting: Internal Medicine

## 2021-03-09 ENCOUNTER — Other Ambulatory Visit (HOSPITAL_COMMUNITY): Payer: Self-pay

## 2021-03-09 MED ORDER — SERTRALINE HCL 100 MG PO TABS
100.0000 mg | ORAL_TABLET | Freq: Every day | ORAL | 3 refills | Status: DC
  Filled 2021-03-09: qty 90, 90d supply, fill #0
  Filled 2021-06-07: qty 90, 90d supply, fill #1
  Filled 2021-09-15: qty 90, 90d supply, fill #2
  Filled 2021-12-17: qty 90, 90d supply, fill #3

## 2021-03-13 ENCOUNTER — Other Ambulatory Visit (HOSPITAL_COMMUNITY): Payer: Self-pay

## 2021-03-25 ENCOUNTER — Ambulatory Visit (AMBULATORY_SURGERY_CENTER): Payer: Self-pay | Admitting: *Deleted

## 2021-03-25 ENCOUNTER — Other Ambulatory Visit: Payer: Self-pay

## 2021-03-25 VITALS — Ht 62.0 in | Wt 115.0 lb

## 2021-03-25 DIAGNOSIS — Z1211 Encounter for screening for malignant neoplasm of colon: Secondary | ICD-10-CM

## 2021-03-25 NOTE — Progress Notes (Signed)
Patient is here in-person for PV. Patient denies any allergies to eggs or soy. Patient denies any problems with anesthesia/sedation. Patient denies any oxygen use at home. Patient denies taking any diet/weight loss medications or blood thinners. Patient is not being treated for MRSA or C-diff. Patient is aware of our care-partner policy and [id number] safety protocol.

## 2021-04-02 ENCOUNTER — Encounter: Payer: Self-pay | Admitting: Internal Medicine

## 2021-04-03 ENCOUNTER — Ambulatory Visit (AMBULATORY_SURGERY_CENTER): Payer: BC Managed Care – PPO | Admitting: Internal Medicine

## 2021-04-03 ENCOUNTER — Other Ambulatory Visit: Payer: Self-pay | Admitting: Internal Medicine

## 2021-04-03 ENCOUNTER — Other Ambulatory Visit: Payer: Self-pay

## 2021-04-03 ENCOUNTER — Encounter: Payer: Self-pay | Admitting: Internal Medicine

## 2021-04-03 VITALS — BP 110/62 | HR 57 | Temp 98.0°F | Resp 12 | Ht 62.0 in | Wt 115.0 lb

## 2021-04-03 DIAGNOSIS — Z1211 Encounter for screening for malignant neoplasm of colon: Secondary | ICD-10-CM | POA: Diagnosis not present

## 2021-04-03 DIAGNOSIS — Z860100 Personal history of colon polyps, unspecified: Secondary | ICD-10-CM | POA: Insufficient documentation

## 2021-04-03 DIAGNOSIS — D123 Benign neoplasm of transverse colon: Secondary | ICD-10-CM

## 2021-04-03 DIAGNOSIS — K635 Polyp of colon: Secondary | ICD-10-CM | POA: Diagnosis not present

## 2021-04-03 HISTORY — DX: Personal history of colonic polyps: Z86.010

## 2021-04-03 MED ORDER — SODIUM CHLORIDE 0.9 % IV SOLN: 500.0000 mL | Freq: Once | INTRAVENOUS | Status: AC

## 2021-04-03 NOTE — Progress Notes (Signed)
[facility]  Pt's states no medical or surgical changes since previsit or office visit.  

## 2021-04-03 NOTE — Patient Instructions (Addendum)
I found and removed one tiny polyp. Saw some hemorrhoids.  All else ok.  I will let you know pathology results and when to have another routine colonoscopy by mail and/or My Chart.  I appreciate the opportunity to care for you. Gatha Mayer, MD, [facility]  Information on polyps and hemorrhoids given to you today.  Await pathology results.  Resume previous diet and medications.  YOU HAD AN ENDOSCOPIC PROCEDURE TODAY AT [facility]:   Refer to the procedure report that was given to you for any specific questions about what was found during the examination.  If the procedure report does not answer your questions, please call your gastroenterologist to clarify.  If you requested that your care partner not be given the details of your procedure findings, then the procedure report has been included in a sealed envelope for you to review at your convenience later.  YOU SHOULD EXPECT: Some feelings of bloating in the abdomen. Passage of more gas than usual.  Walking can help get rid of the air that was put into your GI tract during the procedure and reduce the bloating. If you had a lower endoscopy (such as a colonoscopy or flexible sigmoidoscopy) you may notice spotting of blood in your stool or on the toilet paper. If you underwent a bowel prep for your procedure, you may not have a normal bowel movement for a few days.  Please Note:  You might notice some irritation and congestion in your nose or some drainage.  This is from the oxygen used during your procedure.  There is no need for concern and it should clear up in a day or so.  SYMPTOMS TO REPORT IMMEDIATELY:   Following lower endoscopy (colonoscopy or flexible sigmoidoscopy):  Excessive amounts of blood in the stool  Significant tenderness or worsening of abdominal pains  Swelling of the abdomen that is new, acute  Fever of 100F or higher   For urgent or emergent issues, a gastroenterologist can be reached at any hour  by calling [phone]. Do not use MyChart messaging for urgent concerns.    DIET:  We do recommend a small meal at first, but then you may proceed to your regular diet.  Drink plenty of fluids but you should avoid alcoholic beverages for 24 hours.  ACTIVITY:  You should plan to take it easy for the rest of today and you should NOT DRIVE or use heavy machinery until tomorrow (because of the sedation medicines used during the test).    FOLLOW UP: Our staff will call the number listed on your records 48-72 hours following your procedure to check on you and address any questions or concerns that you may have regarding the information given to you following your procedure. If we do not reach you, we will leave a message.  We will attempt to reach you two times.  During this call, we will ask if you have developed any symptoms of COVID 19. If you develop any symptoms (ie: fever, flu-like symptoms, shortness of breath, cough etc.) before then, please call [phone].  If you test positive for Covid 19 in the 2 weeks post procedure, please call and report this information to [place].    If any biopsies were taken you will be contacted by phone or by letter within the next 1-3 weeks.  Please call us at [phone] if you have not heard about the biopsies in 3 weeks.    SIGNATURES/CONFIDENTIALITY: You and/or your care partner  have signed paperwork which will be entered into your electronic medical record.  These signatures attest to the fact that that the information above on your After Visit Summary has been reviewed and is understood.  Full responsibility of the confidentiality of this discharge information lies with you and/or your care-partner.

## 2021-04-03 NOTE — Progress Notes (Signed)
Called to room to assist during endoscopic procedure.  Patient ID and intended procedure confirmed with present staff. Received instructions for my participation in the procedure from the performing physician.  

## 2021-04-03 NOTE — Op Note (Signed)
[facility] Patient Name: Courtney Bentley Procedure Date: 04/03/2021 3:22 PM MRN: [id number] Endoscopist: Gatha Mayer , MD Age: 50 Referring MD:  Date of Birth: 05-27-1971 Gender: Female Account #: [id number] Procedure:                Colonoscopy Indications:              Screening for colorectal malignant neoplasm, This                            is the patient's first colonoscopy Medicines:                Monitored Anesthesia Care Procedure:                Pre-Anesthesia Assessment:                           - Prior to the procedure, a History and Physical                            was performed, and patient medications and                            allergies were reviewed. The patient's tolerance of                            previous anesthesia was also reviewed. The risks                            and benefits of the procedure and the sedation                            options and risks were discussed with the patient.                            All questions were answered, and informed consent                            was obtained. Prior Anticoagulants: The patient has                            taken no previous anticoagulant or antiplatelet                            agents. ASA Grade Assessment: II - A patient with                            mild systemic disease. After reviewing the risks                            and benefits, the patient was deemed in                            satisfactory condition to undergo the procedure.  After obtaining informed consent, the colonoscope                            was passed under direct vision. Throughout the                            procedure, the patient's blood pressure, pulse, and                            oxygen saturations were monitored continuously. The                            Olympus [id number]) Colonoscope was                            introduced through the anus  and advanced to the the                            cecum, identified by appendiceal orifice and                            ileocecal valve. The colonoscopy was performed                            without difficulty. The patient tolerated the                            procedure well. The quality of the bowel                            preparation was good. The ileocecal valve,                            appendiceal orifice, and rectum were photographed.                            The bowel preparation used was Miralax via split                            dose instruction. Scope In: 3:46:10 PM Scope Out: 4:03:41 PM Scope Withdrawal Time: 0 hours 13 minutes 37 seconds  Total Procedure Duration: 0 hours 17 minutes 31 seconds  Findings:                 The perianal and digital rectal examinations were                            normal.                           A 5 mm polyp was found in the transverse colon. The                            polyp was sessile. The polyp was removed with a  cold snare. Resection and retrieval were complete.                            Verification of patient identification for the                            specimen was done. Estimated blood loss was minimal.                           External hemorrhoids were found.                           The exam was otherwise without abnormality on                            direct and retroflexion views. Complications:            No immediate complications. Estimated Blood Loss:     Estimated blood loss was minimal. Impression:               - One 5 mm polyp in the transverse colon, removed                            with a cold snare. Resected and retrieved.                           - External hemorrhoids.                           - The examination was otherwise normal on direct                            and retroflexion views. Recommendation:           - Patient has a contact number  available for                            emergencies. The signs and symptoms of potential                            delayed complications were discussed with the                            patient. Return to normal activities tomorrow.                            Written discharge instructions were provided to the                            patient.                           - Resume previous diet.                           - Continue present medications.                           -  Repeat colonoscopy is recommended. The                            colonoscopy date will be determined after pathology                            results from today's exam become available for                            review. Gatha Mayer, MD 04/03/2021 4:09:45 PM This report has been signed electronically.

## 2021-04-07 ENCOUNTER — Telehealth: Payer: Self-pay

## 2021-04-07 NOTE — Telephone Encounter (Signed)
  Follow up Call-  Call back number 04/03/2021  Post procedure Call Back phone  # [phone]  Permission to leave phone message Yes  Some recent data might be hidden     Patient questions:  Do you have a fever, pain , or abdominal swelling? No. Pain Score  0 *  Have you tolerated food without any problems? Yes.    Have you been able to return to your normal activities? Yes.    Do you have any questions about your discharge instructions: Diet   No. Medications  No. Follow up visit  No.  Do you have questions or concerns about your Care? No.  Actions: * If pain score is 4 or above: No action needed, pain <4. 1. Have you developed a fever since your procedure? no  2.   Have you had an respiratory symptoms (SOB or cough) since your procedure? no  3.   Have you tested positive for COVID 19 since your procedure no  4.   Have you had any family members/close contacts diagnosed with the COVID 19 since your procedure?  no   If yes to any of these questions please route to Joylene John, RN and Joella Prince, RN

## 2021-04-16 ENCOUNTER — Encounter: Payer: Self-pay | Admitting: Internal Medicine

## 2021-04-16 DIAGNOSIS — Z86010 Personal history of colonic polyps: Secondary | ICD-10-CM

## 2021-06-08 ENCOUNTER — Other Ambulatory Visit (HOSPITAL_COMMUNITY): Payer: Self-pay

## 2021-09-16 ENCOUNTER — Other Ambulatory Visit (HOSPITAL_COMMUNITY): Payer: Self-pay

## 2021-12-03 ENCOUNTER — Other Ambulatory Visit (HOSPITAL_COMMUNITY): Payer: Self-pay

## 2021-12-18 ENCOUNTER — Other Ambulatory Visit (HOSPITAL_COMMUNITY): Payer: Self-pay

## 2021-12-25 DIAGNOSIS — H35362 Drusen (degenerative) of macula, left eye: Secondary | ICD-10-CM | POA: Diagnosis not present

## 2021-12-25 DIAGNOSIS — H52203 Unspecified astigmatism, bilateral: Secondary | ICD-10-CM | POA: Diagnosis not present

## 2021-12-25 DIAGNOSIS — H5213 Myopia, bilateral: Secondary | ICD-10-CM | POA: Diagnosis not present

## 2021-12-25 DIAGNOSIS — H04123 Dry eye syndrome of bilateral lacrimal glands: Secondary | ICD-10-CM | POA: Diagnosis not present

## 2022-01-01 DIAGNOSIS — Z79899 Other long term (current) drug therapy: Secondary | ICD-10-CM | POA: Diagnosis not present

## 2022-01-08 DIAGNOSIS — Z01419 Encounter for gynecological examination (general) (routine) without abnormal findings: Secondary | ICD-10-CM | POA: Diagnosis not present

## 2022-01-08 DIAGNOSIS — Z130 Encounter for screening for diseases of the blood and blood-forming organs and certain disorders involving the immune mechanism: Secondary | ICD-10-CM | POA: Diagnosis not present

## 2022-01-08 DIAGNOSIS — Z1151 Encounter for screening for human papillomavirus (HPV): Secondary | ICD-10-CM | POA: Diagnosis not present

## 2022-01-08 DIAGNOSIS — Z1231 Encounter for screening mammogram for malignant neoplasm of breast: Secondary | ICD-10-CM | POA: Diagnosis not present

## 2022-01-08 DIAGNOSIS — Z124 Encounter for screening for malignant neoplasm of cervix: Secondary | ICD-10-CM | POA: Diagnosis not present

## 2022-01-15 ENCOUNTER — Other Ambulatory Visit (HOSPITAL_COMMUNITY): Payer: Self-pay

## 2022-01-15 DIAGNOSIS — Z1212 Encounter for screening for malignant neoplasm of rectum: Secondary | ICD-10-CM | POA: Diagnosis not present

## 2022-01-15 DIAGNOSIS — F419 Anxiety disorder, unspecified: Secondary | ICD-10-CM | POA: Diagnosis not present

## 2022-01-15 DIAGNOSIS — R82998 Other abnormal findings in urine: Secondary | ICD-10-CM | POA: Diagnosis not present

## 2022-01-15 DIAGNOSIS — Z0000 Encounter for general adult medical examination without abnormal findings: Secondary | ICD-10-CM | POA: Diagnosis not present

## 2022-01-15 DIAGNOSIS — Z1331 Encounter for screening for depression: Secondary | ICD-10-CM | POA: Diagnosis not present

## 2022-01-15 MED ORDER — SERTRALINE HCL 100 MG PO TABS
100.0000 mg | ORAL_TABLET | Freq: Every day | ORAL | 3 refills | Status: DC
  Filled 2022-03-28: qty 90, 90d supply, fill #0
  Filled 2022-07-10: qty 90, 90d supply, fill #1
  Filled 2022-10-12: qty 90, 90d supply, fill #2

## 2022-01-21 ENCOUNTER — Other Ambulatory Visit: Payer: Self-pay | Admitting: Obstetrics and Gynecology

## 2022-01-21 ENCOUNTER — Other Ambulatory Visit (HOSPITAL_COMMUNITY): Payer: Self-pay

## 2022-01-21 DIAGNOSIS — R928 Other abnormal and inconclusive findings on diagnostic imaging of breast: Secondary | ICD-10-CM

## 2022-01-21 DIAGNOSIS — N6489 Other specified disorders of breast: Secondary | ICD-10-CM

## 2022-01-21 MED ORDER — ALPRAZOLAM 0.25 MG PO TABS
0.2500 mg | ORAL_TABLET | Freq: Two times a day (BID) | ORAL | 0 refills | Status: AC
  Filled 2022-01-21: qty 20, 10d supply, fill #0

## 2022-01-22 ENCOUNTER — Other Ambulatory Visit (HOSPITAL_COMMUNITY): Payer: Self-pay

## 2022-01-22 MED ORDER — FLUCONAZOLE 150 MG PO TABS
150.0000 mg | ORAL_TABLET | ORAL | 0 refills | Status: DC | PRN
  Filled 2022-01-22: qty 1, 3d supply, fill #0

## 2022-02-12 ENCOUNTER — Ambulatory Visit
Admission: RE | Admit: 2022-02-12 | Discharge: 2022-02-12 | Disposition: A | Discharge status: Home / Self Care | Payer: BC Managed Care – PPO | Source: Ambulatory Visit | Attending: Obstetrics and Gynecology | Admitting: Obstetrics and Gynecology

## 2022-02-12 ENCOUNTER — Other Ambulatory Visit: Payer: Self-pay | Admitting: Obstetrics and Gynecology

## 2022-02-12 DIAGNOSIS — R928 Other abnormal and inconclusive findings on diagnostic imaging of breast: Secondary | ICD-10-CM

## 2022-02-12 DIAGNOSIS — R922 Inconclusive mammogram: Secondary | ICD-10-CM | POA: Diagnosis not present

## 2022-03-19 ENCOUNTER — Other Ambulatory Visit (HOSPITAL_COMMUNITY): Payer: Self-pay

## 2022-03-19 MED ORDER — FLUCONAZOLE 150 MG PO TABS
150.0000 mg | ORAL_TABLET | ORAL | 0 refills | Status: AC | PRN
  Filled 2022-03-19: qty 2, 6d supply, fill #0

## 2022-03-29 ENCOUNTER — Other Ambulatory Visit (HOSPITAL_COMMUNITY): Payer: Self-pay

## 2022-04-07 IMAGING — US US BREAST*R* LIMITED INC AXILLA
1 series · 8 of 8 positions shown · non-contrast
Comparison: Previous exam(s).

CLINICAL DATA: 50-year-old female presenting as a recall from
screening for possible right breast asymmetry.

EXAM:
DIGITAL DIAGNOSTIC UNILATERAL RIGHT MAMMOGRAM WITH IMPLANTS, CAD AND
TOMOSYNTHESIS; ULTRASOUND RIGHT BREAST LIMITED
TECHNIQUE: Right digital diagnostic mammography and breast tomosynthesis was
performed. The images were evaluated with computer-aided detection.
Standard and/or implant displaced views were performed.; Targeted
ultrasound examination of the right breast was performed

[Series 1: us breast*right* limited inc axilla · 0.04mm/px · 8 of 8 slices shown]
[im 1/8]
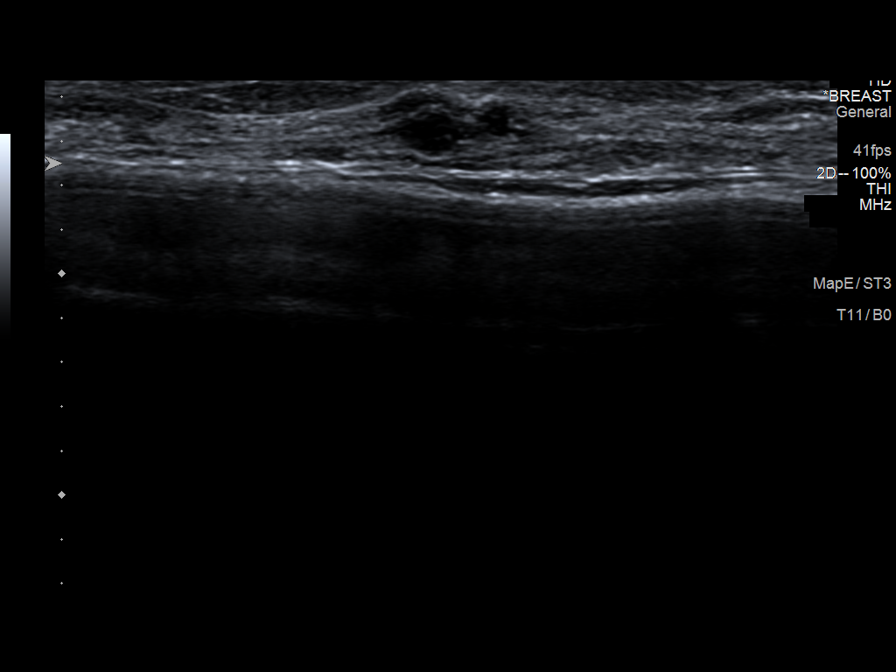
[im 2/8]
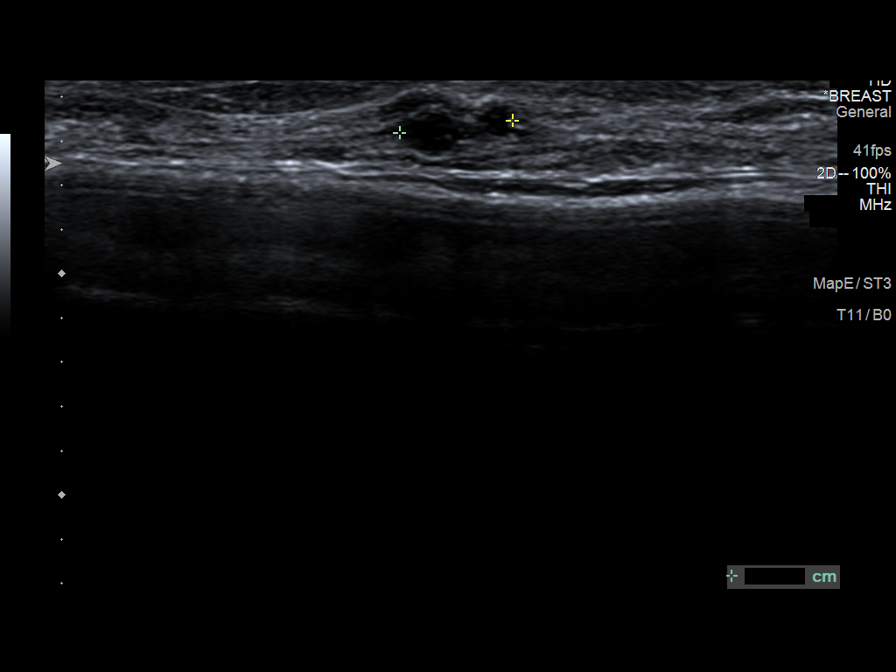
[im 3/8]
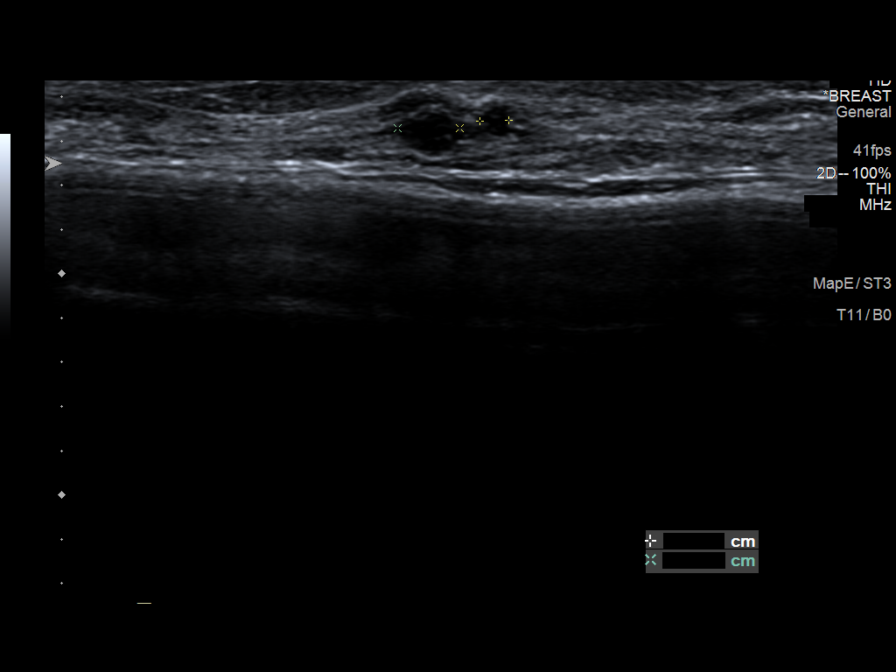
[im 4/8]
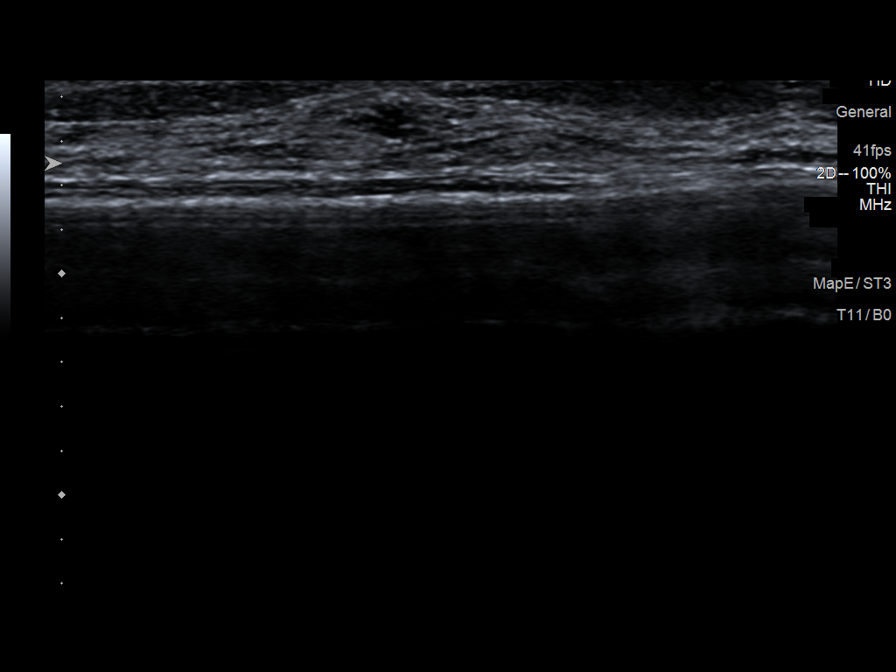
[im 5/8]
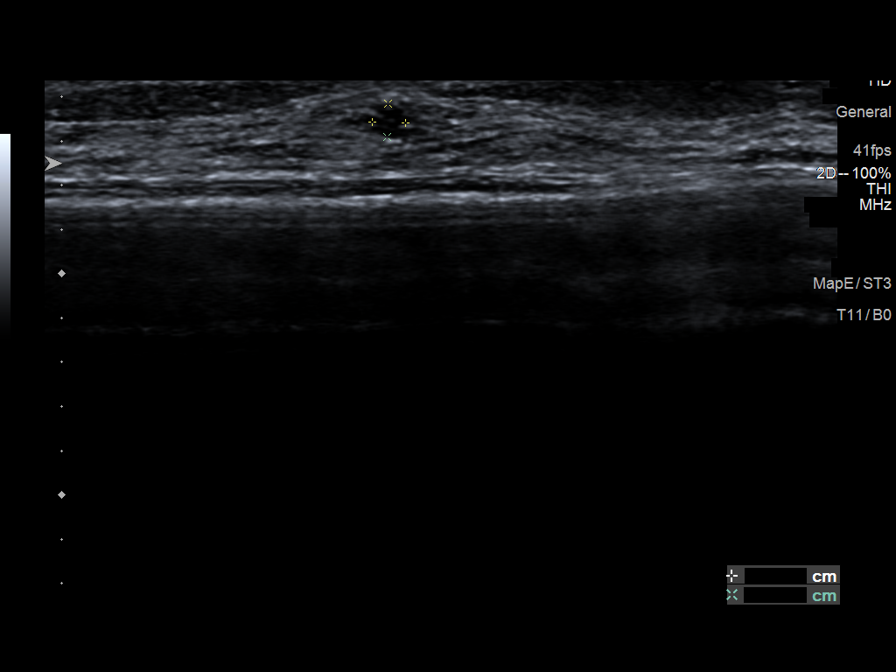
[im 6/8]
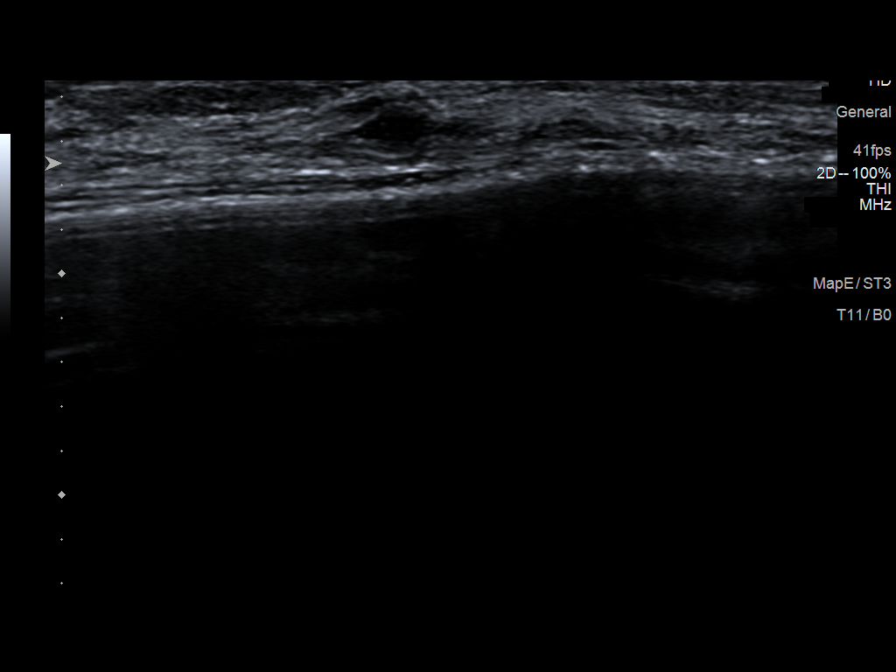
[im 7/8]
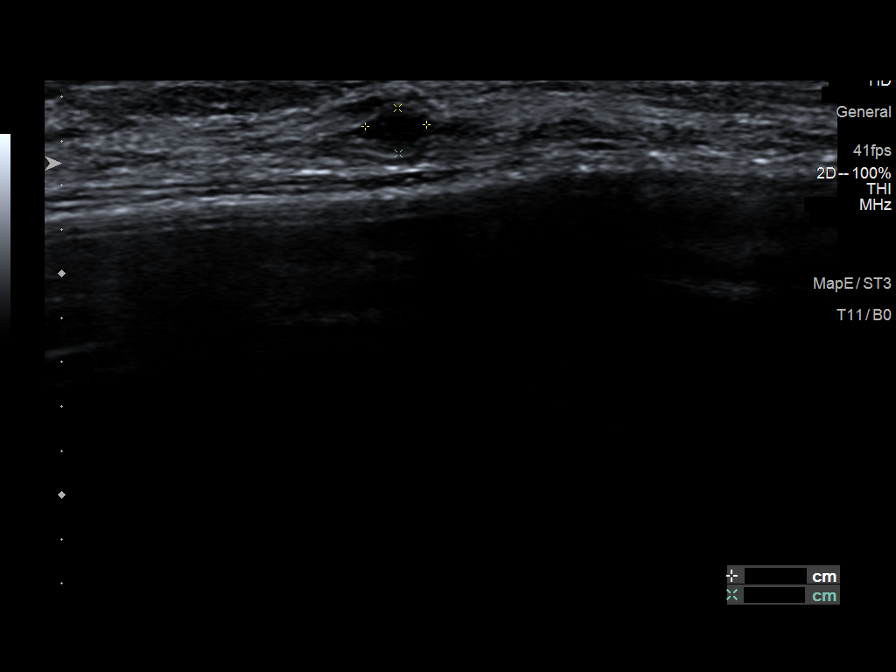
[im 8/8]
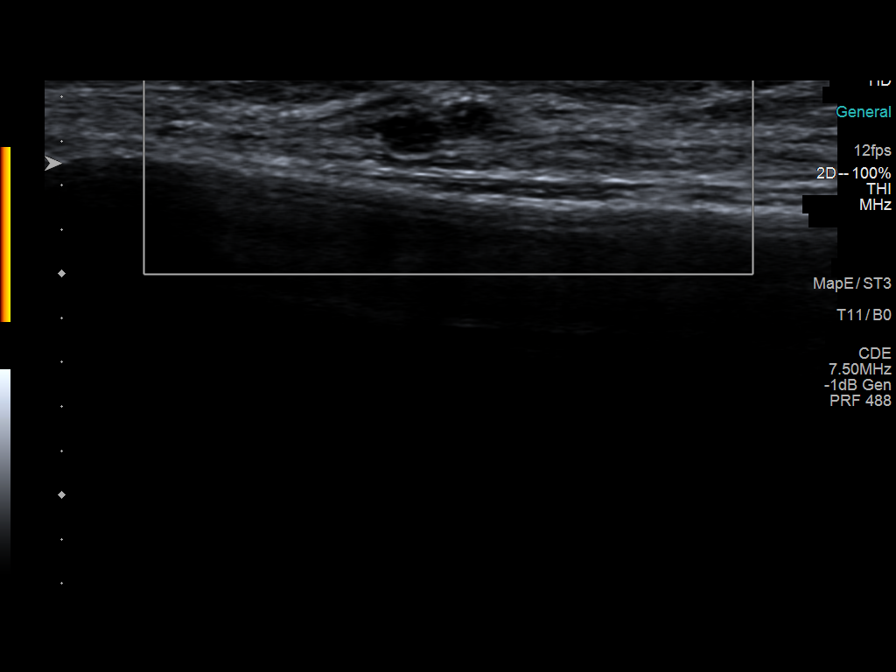

[8 of 8 positions shown; findings below may reference images not displayed]

ACR Breast Density Category d: The breast tissue is extremely dense,
which lowers the sensitivity of mammography.
FINDINGS: Mammogram:

Spot compression tomosynthesis cc and full field mL tomosynthesis
views of the right breast were performed. The questioned asymmetry
measuring approximately 6 mm in the outer retroareolar right breast
does not definitely persist on the additional imaging.

Ultrasound:

Targeted ultrasound is performed in the right breast at 9 o'clock 2
cm from the nipple demonstrating adjacent anechoic oval
circumscribed masses, consistent with benign simple cysts overall
measuring 0.5 cm. This likely corresponds to the mammographic
finding. No additional cystic or solid mass identified.
IMPRESSION: Subcentimeter benign simple cysts in the right breast at 9 o'clock.

RECOMMENDATION:
Screening mammogram in one year.(Code:B3-W-BWO)

I have discussed the findings and recommendations with the patient.
If applicable, a reminder letter will be sent to the patient
regarding the next appointment.

BI-RADS CATEGORY  2: Benign.

## 2022-04-07 IMAGING — MG MM DIGITAL DIAGNOSTIC UNILAT*R* IMPLANT W/ TOMO W/ CAD
4 series · 4 of 12 positions shown · non-contrast
Comparison: Previous exam(s).

CLINICAL DATA: 50-year-old female presenting as a recall from
screening for possible right breast asymmetry.

EXAM:
DIGITAL DIAGNOSTIC UNILATERAL RIGHT MAMMOGRAM WITH IMPLANTS, CAD AND
TOMOSYNTHESIS; ULTRASOUND RIGHT BREAST LIMITED
TECHNIQUE: Right digital diagnostic mammography and breast tomosynthesis was
performed. The images were evaluated with computer-aided detection.
Standard and/or implant displaced views were performed.; Targeted
ultrasound examination of the right breast was performed

[R CC synth-2D]
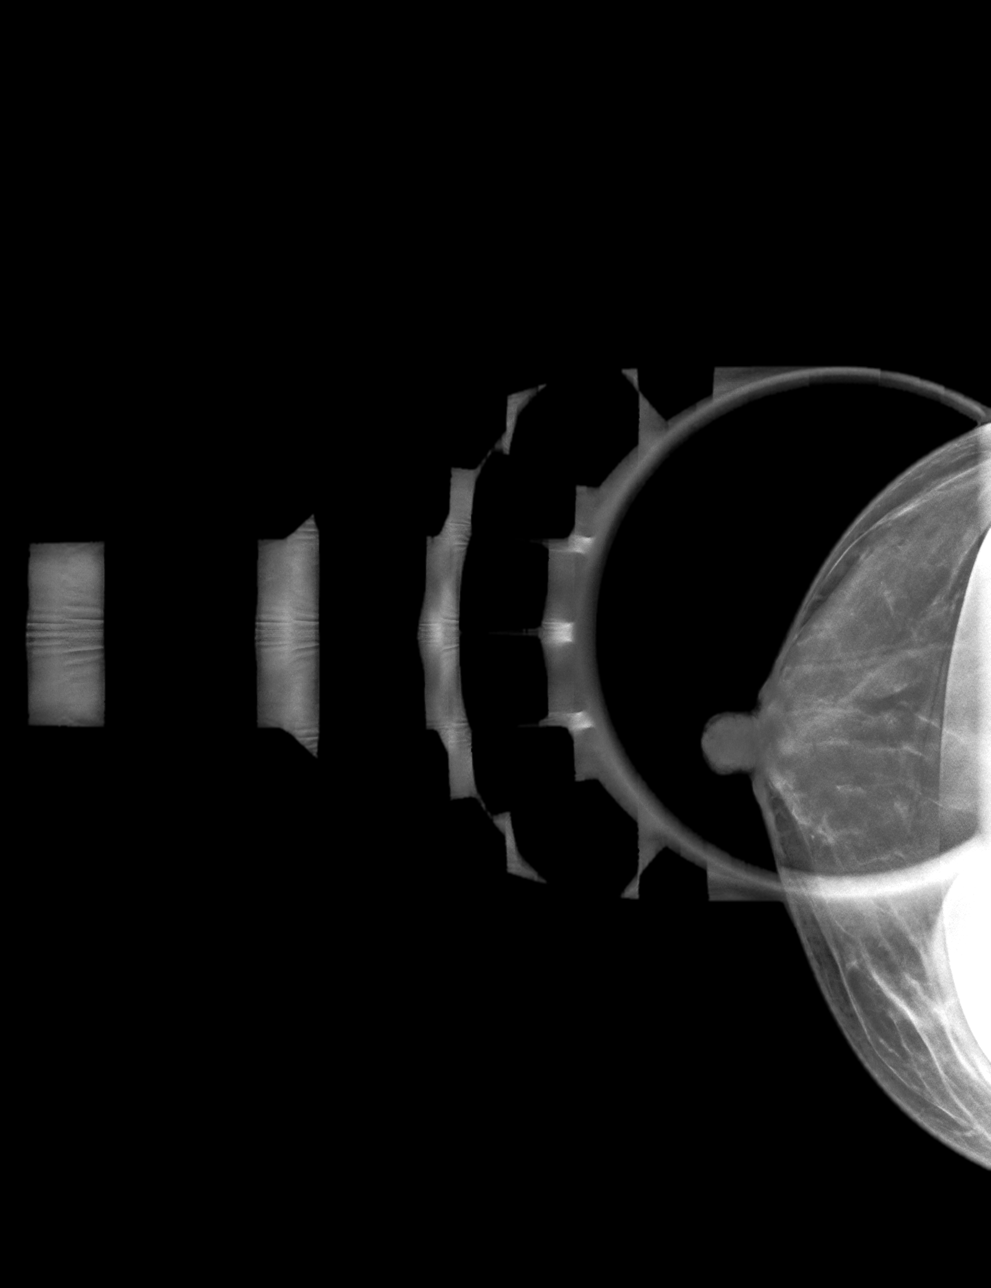

[R ML synth-2D]
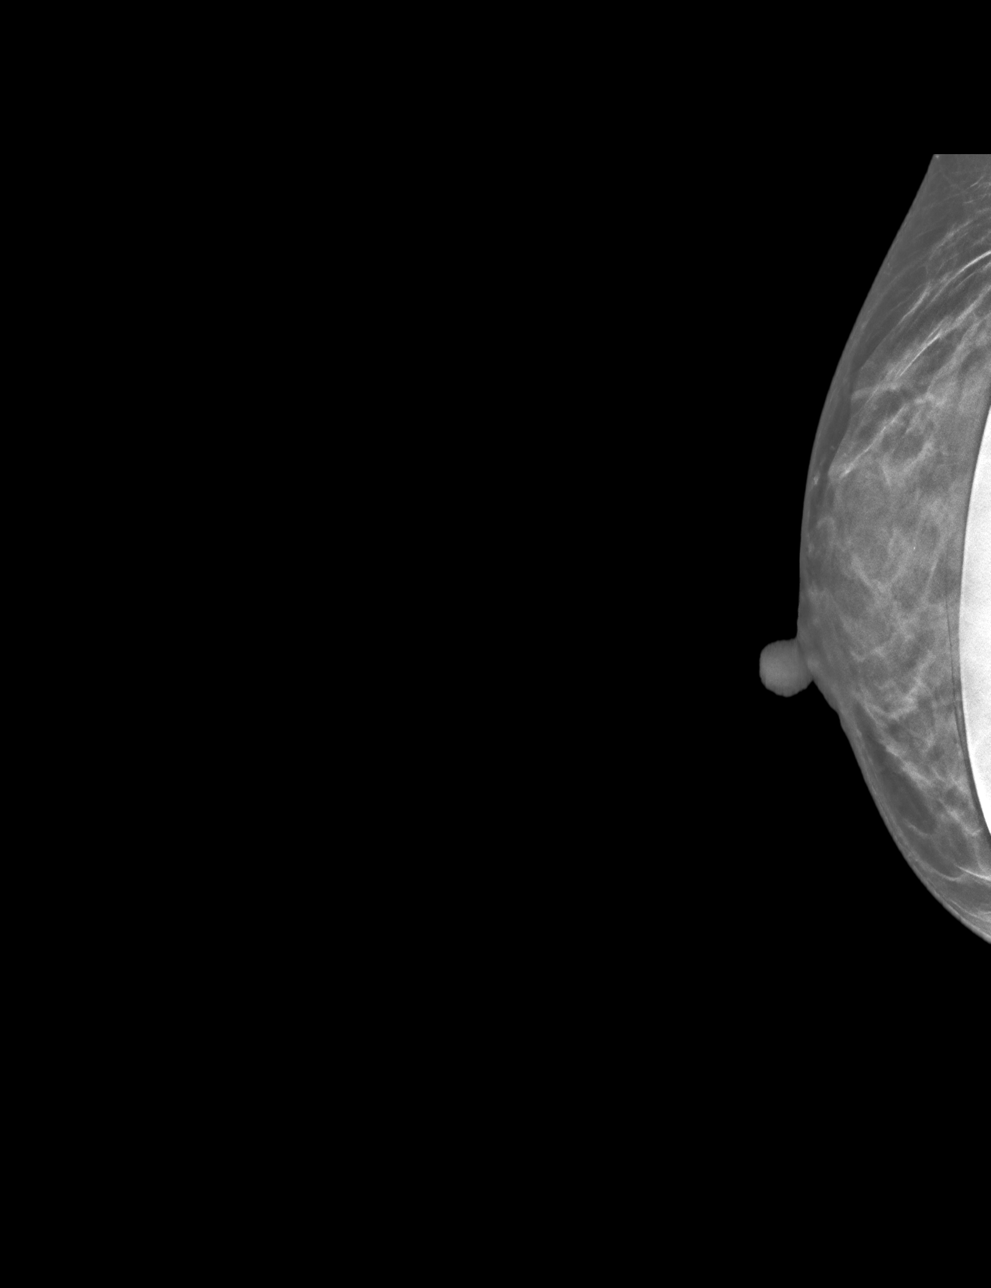

[R MLID BREAST TOMOSYNTHESIS IMAGE tomo · tomo slice 21/40.0]
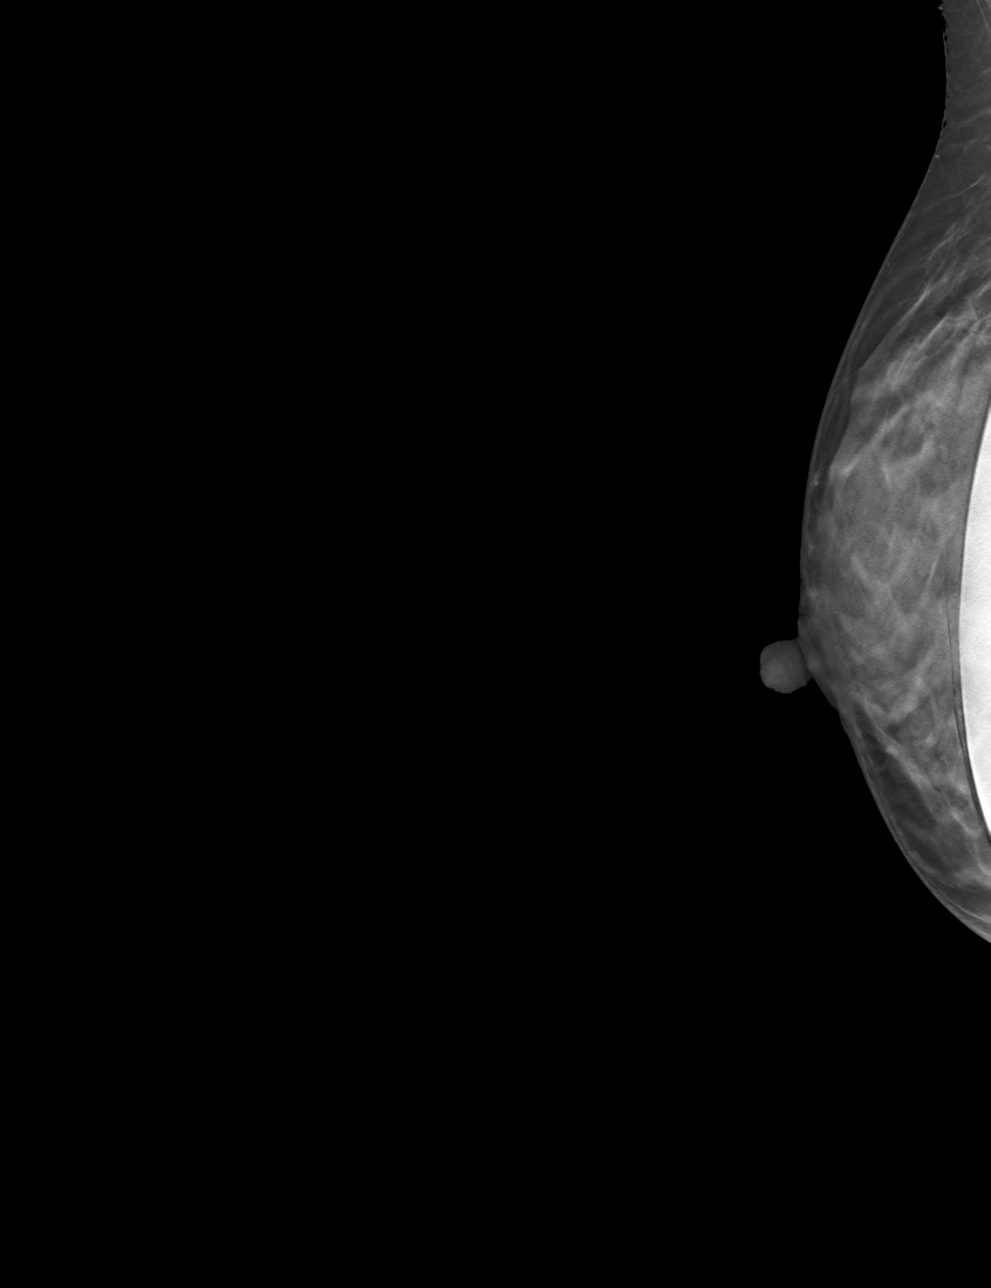

[R CCID BREAST TOMOSYNTHESIS IMAGE tomo · tomo slice 18/35.0]
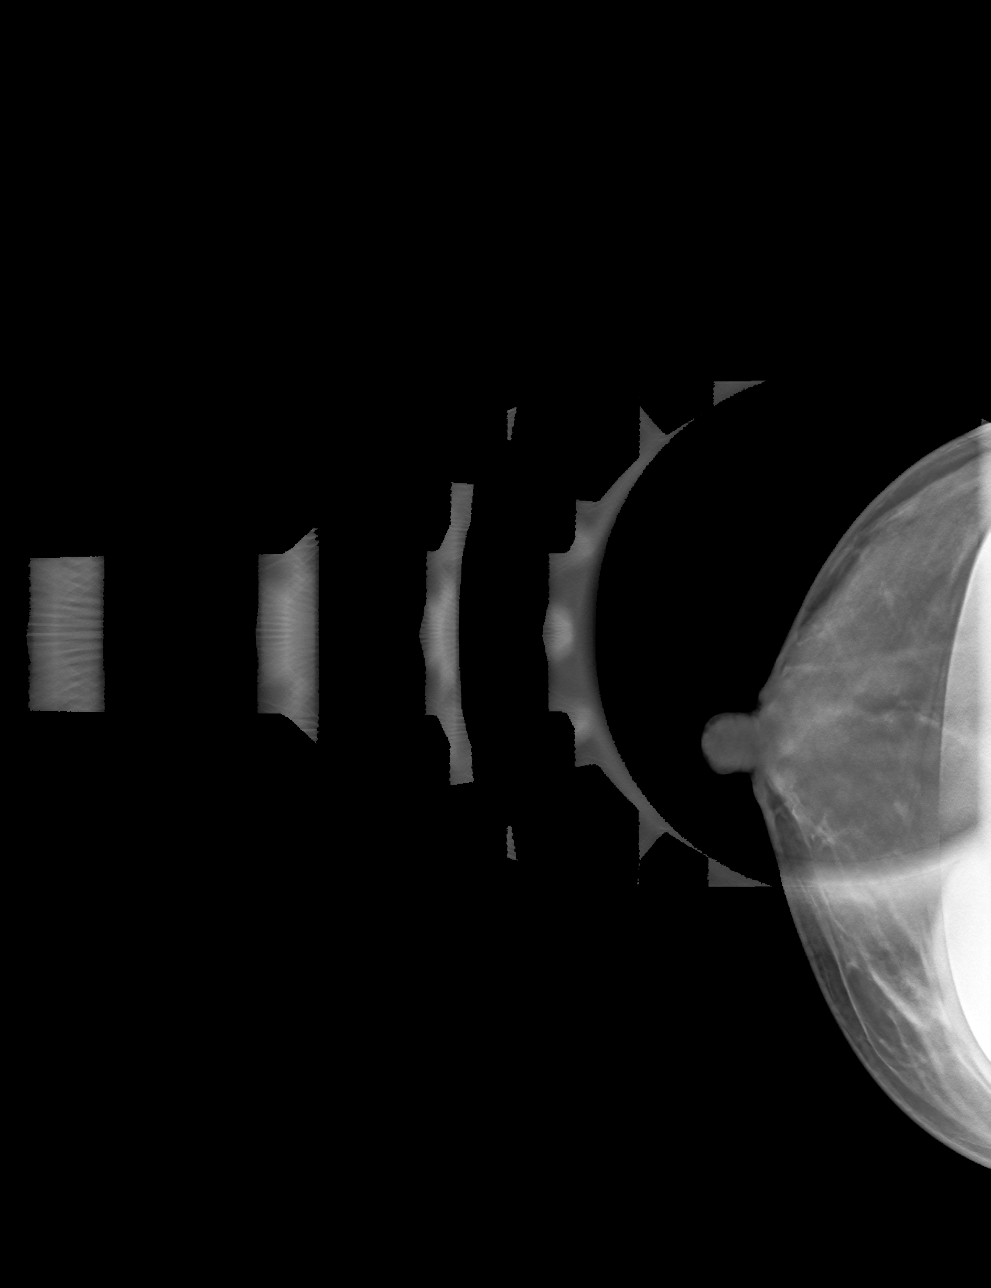

[4 of 12 positions shown; findings below may reference images not displayed]

ACR Breast Density Category d: The breast tissue is extremely dense,
which lowers the sensitivity of mammography.
FINDINGS: Mammogram:

Spot compression tomosynthesis cc and full field mL tomosynthesis
views of the right breast were performed. The questioned asymmetry
measuring approximately 6 mm in the outer retroareolar right breast
does not definitely persist on the additional imaging.

Ultrasound:

Targeted ultrasound is performed in the right breast at 9 o'clock 2
cm from the nipple demonstrating adjacent anechoic oval
circumscribed masses, consistent with benign simple cysts overall
measuring 0.5 cm. This likely corresponds to the mammographic
finding. No additional cystic or solid mass identified.
IMPRESSION: Subcentimeter benign simple cysts in the right breast at 9 o'clock.

RECOMMENDATION:
Screening mammogram in one year.(Code:B3-W-BWO)

I have discussed the findings and recommendations with the patient.
If applicable, a reminder letter will be sent to the patient
regarding the next appointment.

BI-RADS CATEGORY  2: Benign.

## 2022-05-21 DIAGNOSIS — L819 Disorder of pigmentation, unspecified: Secondary | ICD-10-CM | POA: Diagnosis not present

## 2022-05-21 DIAGNOSIS — L570 Actinic keratosis: Secondary | ICD-10-CM | POA: Diagnosis not present

## 2022-05-21 DIAGNOSIS — D225 Melanocytic nevi of trunk: Secondary | ICD-10-CM | POA: Diagnosis not present

## 2022-07-09 DIAGNOSIS — L570 Actinic keratosis: Secondary | ICD-10-CM | POA: Diagnosis not present

## 2022-07-12 ENCOUNTER — Other Ambulatory Visit (HOSPITAL_COMMUNITY): Payer: Self-pay

## 2022-10-13 ENCOUNTER — Other Ambulatory Visit (HOSPITAL_COMMUNITY): Payer: Self-pay

## 2022-11-24 ENCOUNTER — Other Ambulatory Visit (HOSPITAL_COMMUNITY): Payer: Self-pay

## 2022-11-24 MED ORDER — DOXYCYCLINE MONOHYDRATE 100 MG PO CAPS
100.0000 mg | ORAL_CAPSULE | Freq: Two times a day (BID) | ORAL | 0 refills | Status: AC
  Filled 2022-11-24: qty 14, 7d supply, fill #0

## 2022-12-31 DIAGNOSIS — H524 Presbyopia: Secondary | ICD-10-CM | POA: Diagnosis not present

## 2022-12-31 DIAGNOSIS — H5213 Myopia, bilateral: Secondary | ICD-10-CM | POA: Diagnosis not present

## 2022-12-31 DIAGNOSIS — H35362 Drusen (degenerative) of macula, left eye: Secondary | ICD-10-CM | POA: Diagnosis not present

## 2023-01-28 DIAGNOSIS — K219 Gastro-esophageal reflux disease without esophagitis: Secondary | ICD-10-CM | POA: Diagnosis not present

## 2023-01-28 DIAGNOSIS — Z1212 Encounter for screening for malignant neoplasm of rectum: Secondary | ICD-10-CM | POA: Diagnosis not present

## 2023-01-28 DIAGNOSIS — F419 Anxiety disorder, unspecified: Secondary | ICD-10-CM | POA: Diagnosis not present

## 2023-01-28 DIAGNOSIS — Z0000 Encounter for general adult medical examination without abnormal findings: Secondary | ICD-10-CM | POA: Diagnosis not present

## 2023-01-28 DIAGNOSIS — Z79899 Other long term (current) drug therapy: Secondary | ICD-10-CM | POA: Diagnosis not present

## 2023-01-28 DIAGNOSIS — E785 Hyperlipidemia, unspecified: Secondary | ICD-10-CM | POA: Diagnosis not present

## 2023-02-11 ENCOUNTER — Other Ambulatory Visit (HOSPITAL_COMMUNITY): Payer: Self-pay

## 2023-02-11 DIAGNOSIS — J309 Allergic rhinitis, unspecified: Secondary | ICD-10-CM | POA: Diagnosis not present

## 2023-02-11 DIAGNOSIS — F419 Anxiety disorder, unspecified: Secondary | ICD-10-CM | POA: Diagnosis not present

## 2023-02-11 DIAGNOSIS — J45998 Other asthma: Secondary | ICD-10-CM | POA: Diagnosis not present

## 2023-02-11 DIAGNOSIS — J3489 Other specified disorders of nose and nasal sinuses: Secondary | ICD-10-CM | POA: Diagnosis not present

## 2023-02-11 DIAGNOSIS — R82998 Other abnormal findings in urine: Secondary | ICD-10-CM | POA: Diagnosis not present

## 2023-02-11 DIAGNOSIS — Z23 Encounter for immunization: Secondary | ICD-10-CM | POA: Diagnosis not present

## 2023-02-11 DIAGNOSIS — Z0000 Encounter for general adult medical examination without abnormal findings: Secondary | ICD-10-CM | POA: Diagnosis not present

## 2023-02-11 MED ORDER — ALPRAZOLAM 0.25 MG PO TABS
0.2500 mg | ORAL_TABLET | Freq: Two times a day (BID) | ORAL | 0 refills | Status: AC | PRN
  Filled 2023-02-11: qty 20, 10d supply, fill #0

## 2023-02-11 MED ORDER — SERTRALINE HCL 100 MG PO TABS
100.0000 mg | ORAL_TABLET | Freq: Every day | ORAL | 3 refills | Status: AC
  Filled 2023-02-11: qty 90, 90d supply, fill #0

## 2023-02-25 ENCOUNTER — Other Ambulatory Visit (HOSPITAL_COMMUNITY): Payer: Self-pay

## 2023-02-27 ENCOUNTER — Other Ambulatory Visit (HOSPITAL_COMMUNITY): Payer: Self-pay

## 2023-02-28 ENCOUNTER — Other Ambulatory Visit (HOSPITAL_COMMUNITY): Payer: Self-pay

## 2023-02-28 MED ORDER — SERTRALINE HCL 100 MG PO TABS
100.0000 mg | ORAL_TABLET | Freq: Every day | ORAL | 3 refills | Status: AC
  Filled 2023-02-28: qty 90, 90d supply, fill #0
  Filled 2023-05-31: qty 90, 90d supply, fill #1
  Filled 2023-08-30: qty 90, 90d supply, fill #2
  Filled 2023-11-30: qty 90, 90d supply, fill #3

## 2023-05-06 DIAGNOSIS — Z1389 Encounter for screening for other disorder: Secondary | ICD-10-CM | POA: Diagnosis not present

## 2023-05-06 DIAGNOSIS — N921 Excessive and frequent menstruation with irregular cycle: Secondary | ICD-10-CM | POA: Diagnosis not present

## 2023-05-06 DIAGNOSIS — Z1231 Encounter for screening mammogram for malignant neoplasm of breast: Secondary | ICD-10-CM | POA: Diagnosis not present

## 2023-05-06 DIAGNOSIS — Z01419 Encounter for gynecological examination (general) (routine) without abnormal findings: Secondary | ICD-10-CM | POA: Diagnosis not present

## 2023-05-11 ENCOUNTER — Other Ambulatory Visit (HOSPITAL_COMMUNITY): Payer: Self-pay

## 2023-05-31 ENCOUNTER — Other Ambulatory Visit (HOSPITAL_COMMUNITY): Payer: Self-pay

## 2023-06-10 DIAGNOSIS — D259 Leiomyoma of uterus, unspecified: Secondary | ICD-10-CM | POA: Diagnosis not present

## 2023-06-10 DIAGNOSIS — Z3202 Encounter for pregnancy test, result negative: Secondary | ICD-10-CM | POA: Diagnosis not present

## 2023-06-10 DIAGNOSIS — N921 Excessive and frequent menstruation with irregular cycle: Secondary | ICD-10-CM | POA: Diagnosis not present

## 2023-08-08 ENCOUNTER — Other Ambulatory Visit (HOSPITAL_COMMUNITY): Payer: Self-pay

## 2023-08-08 DIAGNOSIS — N921 Excessive and frequent menstruation with irregular cycle: Secondary | ICD-10-CM | POA: Diagnosis not present

## 2023-08-08 MED ORDER — OXYCODONE-ACETAMINOPHEN 5-325 MG PO TABS
1.0000 | ORAL_TABLET | Freq: Four times a day (QID) | ORAL | 0 refills | Status: AC | PRN
  Filled 2023-08-08: qty 5, 2d supply, fill #0

## 2023-08-08 MED ORDER — IBUPROFEN 800 MG PO TABS
800.0000 mg | ORAL_TABLET | Freq: Three times a day (TID) | ORAL | 1 refills | Status: DC | PRN
  Filled 2023-08-08: qty 30, 10d supply, fill #0

## 2023-08-22 ENCOUNTER — Other Ambulatory Visit (HOSPITAL_COMMUNITY): Payer: Self-pay

## 2023-08-22 MED ORDER — FLUCONAZOLE 150 MG PO TABS
150.0000 mg | ORAL_TABLET | ORAL | 0 refills | Status: DC | PRN
  Filled 2023-08-22: qty 1, 1d supply, fill #0

## 2023-08-24 ENCOUNTER — Other Ambulatory Visit (HOSPITAL_COMMUNITY): Payer: Self-pay

## 2023-08-24 MED ORDER — TRIAMCINOLONE ACETONIDE 0.1 % EX CREA
TOPICAL_CREAM | Freq: Two times a day (BID) | CUTANEOUS | 0 refills | Status: AC
Start: 2023-08-24 — End: ?
  Filled 2023-08-24: qty 15, 5d supply, fill #0

## 2023-08-25 DIAGNOSIS — R300 Dysuria: Secondary | ICD-10-CM | POA: Diagnosis not present

## 2023-12-01 ENCOUNTER — Other Ambulatory Visit (HOSPITAL_COMMUNITY): Payer: Self-pay

## 2024-02-03 DIAGNOSIS — H35362 Drusen (degenerative) of macula, left eye: Secondary | ICD-10-CM | POA: Diagnosis not present

## 2024-02-03 DIAGNOSIS — H5213 Myopia, bilateral: Secondary | ICD-10-CM | POA: Diagnosis not present

## 2024-02-03 DIAGNOSIS — H524 Presbyopia: Secondary | ICD-10-CM | POA: Diagnosis not present

## 2024-03-19 ENCOUNTER — Other Ambulatory Visit (HOSPITAL_COMMUNITY): Payer: Self-pay

## 2024-03-19 MED ORDER — SERTRALINE HCL 100 MG PO TABS
100.0000 mg | ORAL_TABLET | Freq: Every day | ORAL | 1 refills | Status: DC
  Filled 2024-03-19: qty 90, 90d supply, fill #0
  Filled 2024-07-13: qty 90, 90d supply, fill #1

## 2024-05-31 DIAGNOSIS — Z124 Encounter for screening for malignant neoplasm of cervix: Secondary | ICD-10-CM | POA: Diagnosis not present

## 2024-05-31 DIAGNOSIS — Z01419 Encounter for gynecological examination (general) (routine) without abnormal findings: Secondary | ICD-10-CM | POA: Diagnosis not present

## 2024-05-31 DIAGNOSIS — Z1231 Encounter for screening mammogram for malignant neoplasm of breast: Secondary | ICD-10-CM | POA: Diagnosis not present

## 2024-05-31 DIAGNOSIS — Z1151 Encounter for screening for human papillomavirus (HPV): Secondary | ICD-10-CM | POA: Diagnosis not present

## 2024-05-31 DIAGNOSIS — E785 Hyperlipidemia, unspecified: Secondary | ICD-10-CM | POA: Diagnosis not present

## 2024-06-08 DIAGNOSIS — D2261 Melanocytic nevi of right upper limb, including shoulder: Secondary | ICD-10-CM | POA: Diagnosis not present

## 2024-06-08 DIAGNOSIS — R82998 Other abnormal findings in urine: Secondary | ICD-10-CM | POA: Diagnosis not present

## 2024-06-08 DIAGNOSIS — D225 Melanocytic nevi of trunk: Secondary | ICD-10-CM | POA: Diagnosis not present

## 2024-06-08 DIAGNOSIS — D2271 Melanocytic nevi of right lower limb, including hip: Secondary | ICD-10-CM | POA: Diagnosis not present

## 2024-06-08 DIAGNOSIS — J309 Allergic rhinitis, unspecified: Secondary | ICD-10-CM | POA: Diagnosis not present

## 2024-06-08 DIAGNOSIS — Z0000 Encounter for general adult medical examination without abnormal findings: Secondary | ICD-10-CM | POA: Diagnosis not present

## 2024-06-08 DIAGNOSIS — D2262 Melanocytic nevi of left upper limb, including shoulder: Secondary | ICD-10-CM | POA: Diagnosis not present

## 2024-06-15 DIAGNOSIS — Z1212 Encounter for screening for malignant neoplasm of rectum: Secondary | ICD-10-CM | POA: Diagnosis not present

## 2024-06-29 DIAGNOSIS — D649 Anemia, unspecified: Secondary | ICD-10-CM | POA: Diagnosis not present

## 2024-07-13 ENCOUNTER — Other Ambulatory Visit (HOSPITAL_COMMUNITY): Payer: Self-pay

## 2024-09-03 DIAGNOSIS — J069 Acute upper respiratory infection, unspecified: Secondary | ICD-10-CM | POA: Diagnosis not present

## 2024-09-03 DIAGNOSIS — R0981 Nasal congestion: Secondary | ICD-10-CM | POA: Diagnosis not present

## 2024-09-07 ENCOUNTER — Other Ambulatory Visit (HOSPITAL_COMMUNITY): Payer: Self-pay

## 2024-09-07 MED ORDER — ALBUTEROL SULFATE HFA 108 (90 BASE) MCG/ACT IN AERS
2.0000 | INHALATION_SPRAY | RESPIRATORY_TRACT | 5 refills | Status: AC | PRN
  Filled 2024-09-07: qty 6.7, 17d supply, fill #0

## 2024-09-07 MED ORDER — DOXYCYCLINE MONOHYDRATE 100 MG PO CAPS
100.0000 mg | ORAL_CAPSULE | Freq: Two times a day (BID) | ORAL | 0 refills | Status: AC
  Filled 2024-09-07: qty 14, 7d supply, fill #0

## 2024-09-07 MED ORDER — PREDNISONE 5 MG (21) PO TBPK
ORAL_TABLET | ORAL | 0 refills | Status: AC
  Filled 2024-09-07: qty 21, 6d supply, fill #0

## 2024-10-04 ENCOUNTER — Other Ambulatory Visit (HOSPITAL_COMMUNITY): Payer: Self-pay

## 2024-10-04 MED ORDER — SERTRALINE HCL 100 MG PO TABS
100.0000 mg | ORAL_TABLET | Freq: Every day | ORAL | 1 refills | Status: AC
  Filled 2024-10-04 – 2024-10-29 (×2): qty 90, 90d supply, fill #0

## 2024-10-16 ENCOUNTER — Other Ambulatory Visit (HOSPITAL_COMMUNITY): Payer: Self-pay

## 2024-10-29 ENCOUNTER — Other Ambulatory Visit (HOSPITAL_COMMUNITY): Payer: Self-pay

## 2024-11-28 ENCOUNTER — Other Ambulatory Visit (HOSPITAL_COMMUNITY): Payer: Self-pay

## 2024-11-28 MED ORDER — ONDANSETRON 8 MG PO TBDP
8.0000 mg | ORAL_TABLET | Freq: Two times a day (BID) | ORAL | 0 refills | Status: AC
  Filled 2024-11-28: qty 15, 8d supply, fill #0

## 2024-11-28 MED ORDER — OSELTAMIVIR PHOSPHATE 75 MG PO CAPS
75.0000 mg | ORAL_CAPSULE | Freq: Two times a day (BID) | ORAL | 0 refills | Status: AC
  Filled 2024-11-28: qty 10, 5d supply, fill #0

## 2025-01-04 ENCOUNTER — Other Ambulatory Visit (HOSPITAL_COMMUNITY): Payer: Self-pay
